# Patient Record
Sex: Male | Born: 1960 | Race: White | Hispanic: No | Smoking: Former smoker
Health system: Southern US, Community
[De-identification: ages and names within clinical notes are randomized; demographics above are authoritative.]

## PROBLEM LIST (undated history)

## (undated) DIAGNOSIS — I2510 Atherosclerotic heart disease of native coronary artery without angina pectoris: Secondary | ICD-10-CM

## (undated) DIAGNOSIS — I509 Heart failure, unspecified: Secondary | ICD-10-CM

## (undated) DIAGNOSIS — Z720 Tobacco use: Secondary | ICD-10-CM

## (undated) HISTORY — DX: Tobacco use: Z72.0

## (undated) HISTORY — DX: Heart failure, unspecified: I50.9

## (undated) HISTORY — DX: Atherosclerotic heart disease of native coronary artery without angina pectoris: I25.10

---

## 2006-02-15 ENCOUNTER — Ambulatory Visit: Payer: Self-pay | Admitting: Specialist

## 2006-02-17 ENCOUNTER — Emergency Department: Payer: Self-pay | Admitting: Internal Medicine

## 2006-10-06 ENCOUNTER — Emergency Department: Payer: Self-pay | Admitting: Emergency Medicine

## 2006-10-07 ENCOUNTER — Emergency Department: Payer: Self-pay | Admitting: Emergency Medicine

## 2007-11-16 IMAGING — CT CT ABD-PELV W/ CM
1 of 3 series · 14 of 32 positions shown, 19 images · non-contrast
Comparison: none

REASON FOR EXAM: Rm 7. IV only  2 day post
COMMENTS:

[Series 3: abd/pelvis · axial · 0.69mm/px · z∈[-464,+10]mm · 14 of 180 slices shown, 19 images]
[im 11/180  soft-tissue]
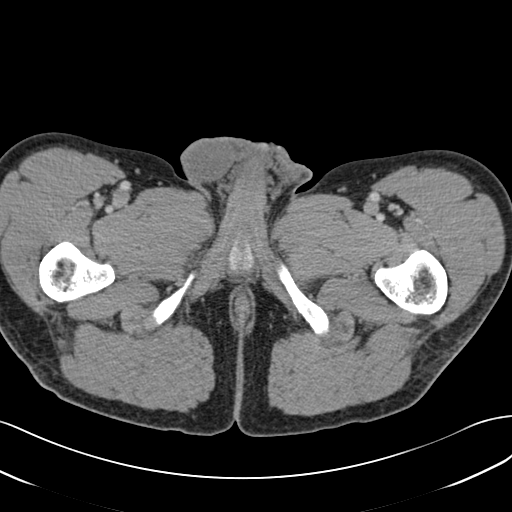
[im 11/180  bone]
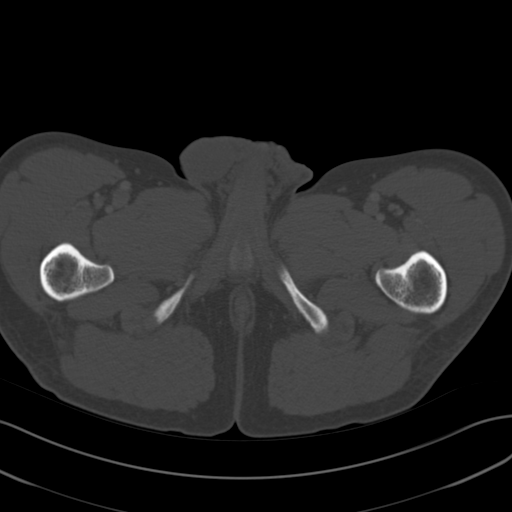
[im 22/180  soft-tissue]
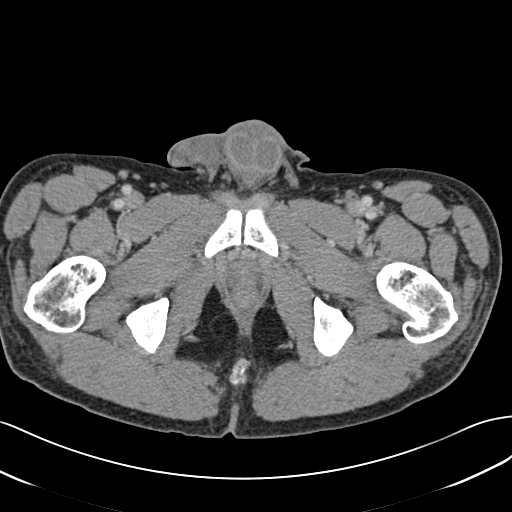
[im 43/180  soft-tissue]
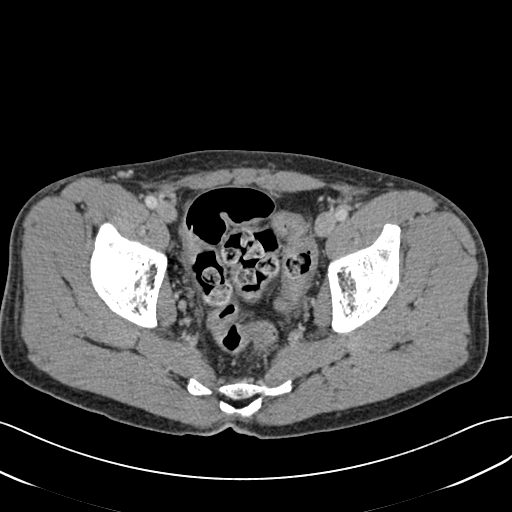
[im 53/180  soft-tissue]
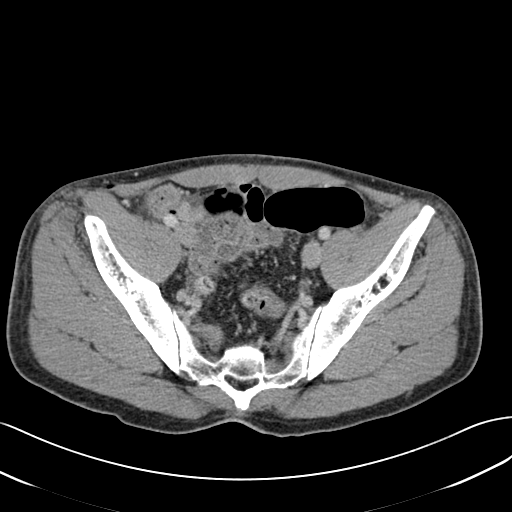
[im 64/180  soft-tissue]
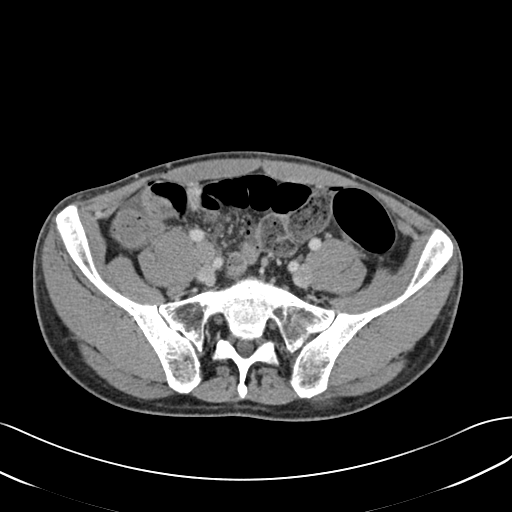
[im 74/180  soft-tissue]
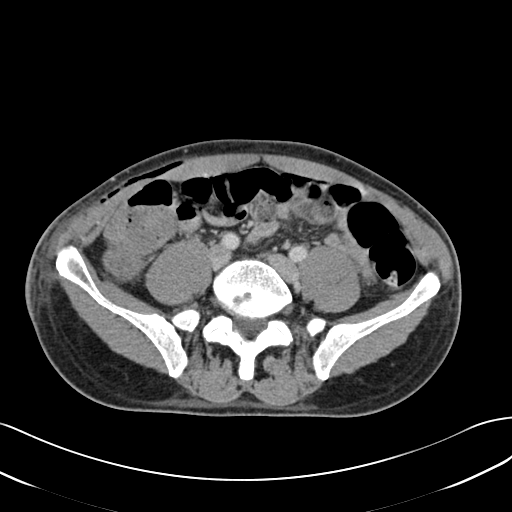
[im 95/180  soft-tissue]
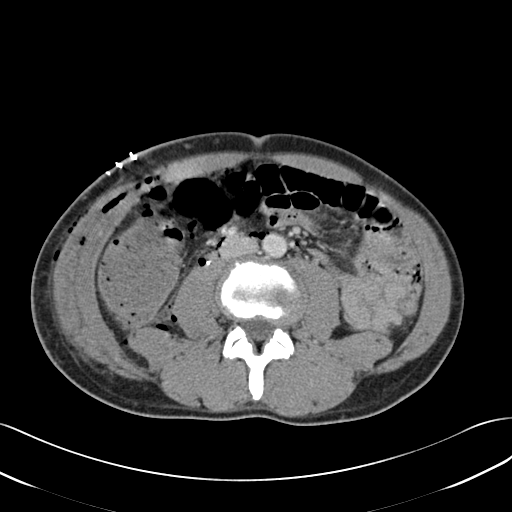
[im 106/180  soft-tissue]
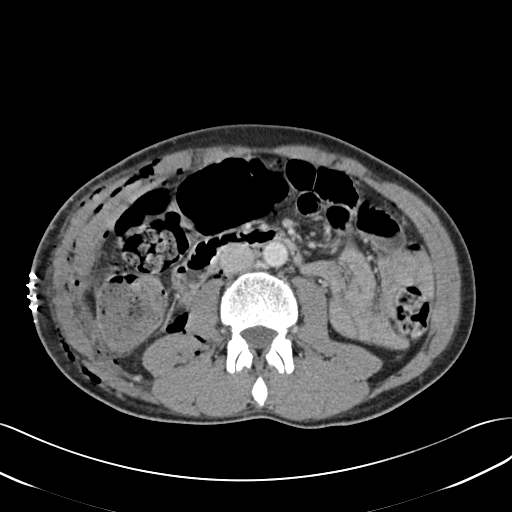
[im 116/180  soft-tissue]
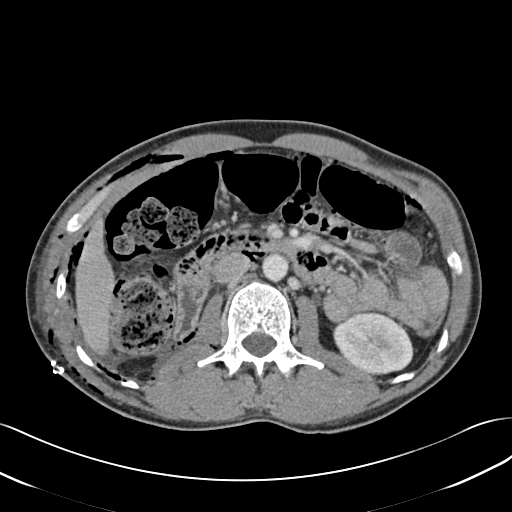
[im 116/180  bone]
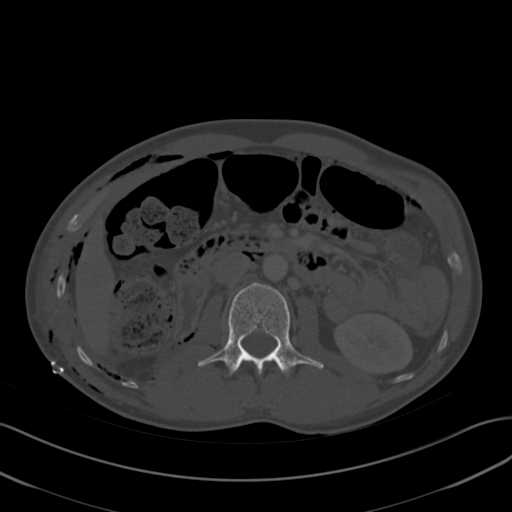
[im 127/180  soft-tissue]
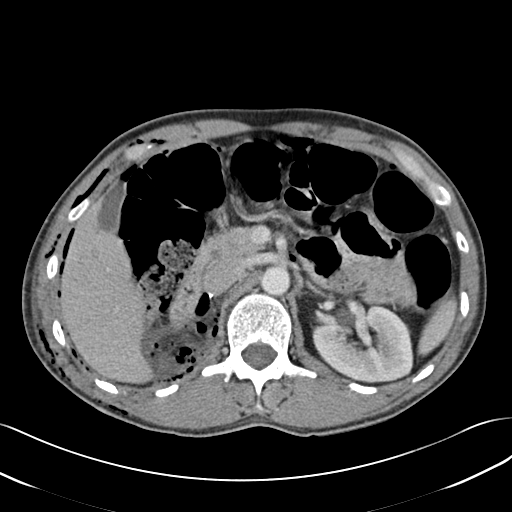
[im 137/180  soft-tissue]
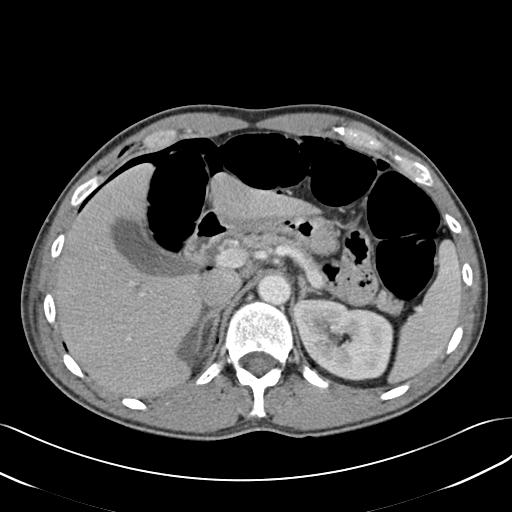
[im 137/180  lung]
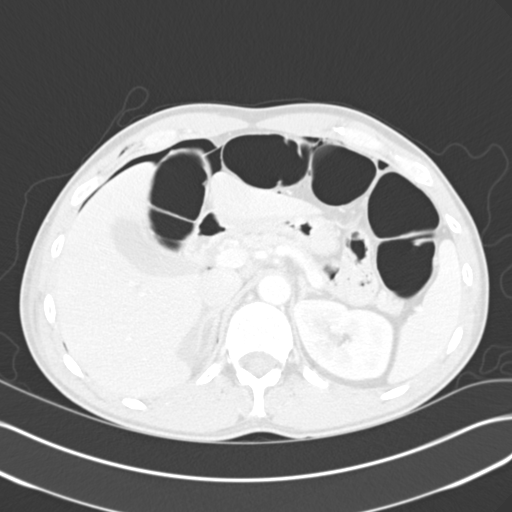
[im 148/180  lung]
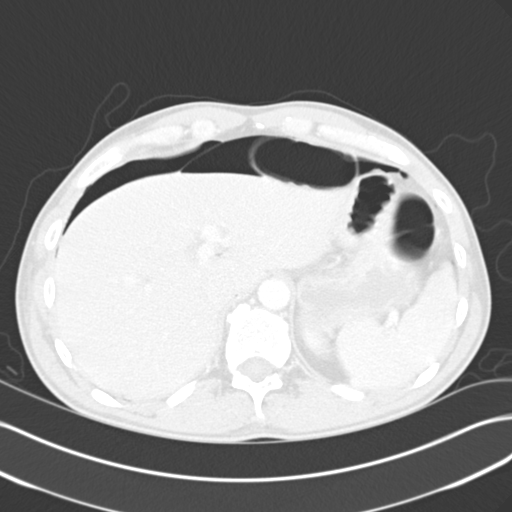
[im 158/180  soft-tissue]
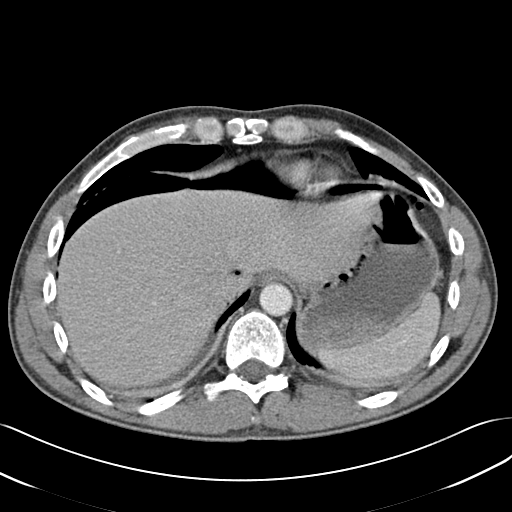
[im 158/180  lung]
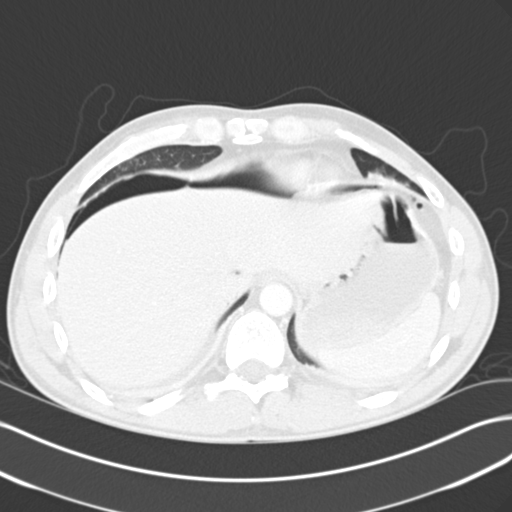
[im 169/180  soft-tissue]
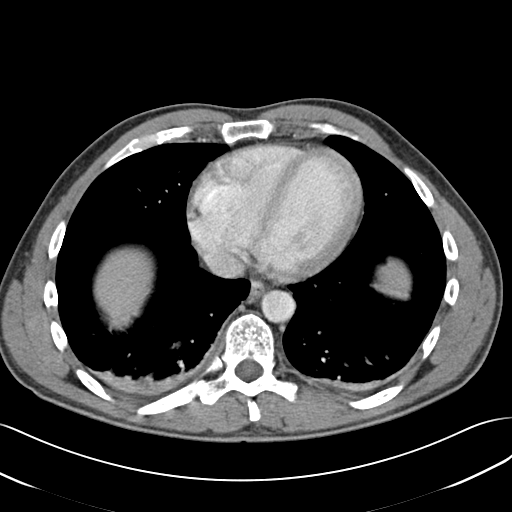
[im 169/180  lung]
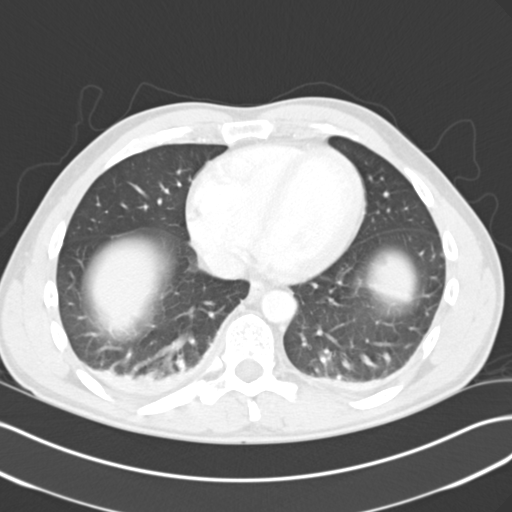

[14 of 32 positions shown; findings below may reference images not displayed]

PROCEDURE:     CT  - CT ABDOMEN / PELVIS  W  - February 17, 2006  [DATE]

RESULT:     An emergent abdominal/pelvic CT if obtained for pain at the
incision site from two days post removal of the RIGHT kidney.  In the RIGHT
renal bed there is noted fluid with multiple air bubbles.  This may all be
post-surgical in nature and no drainable fluid collection is seen.  There
are air bubbles about the iliopsoas.  There is noted free air in the abdomen
about the liver and as stated about the iliopsoas on the RIGHT and extends
anterior to the liver.  Subcutaneous air is noted in the RIGHT lateral
abdominal wall anteriorly extending down to the midpelvis.  There is noted
bilateral small pleural effusions and atelectasis in the lung bases.  The
LEFT kidney is functioning excreting the contrast with no masses, no
hydronephrosis.  The liver and spleen appear intact.  There is incidentally
noted some air droplets about the cava and aorta.  It is not in the pelvis.
IMPRESSION: Findings most likely postoperative in nature with some fluid in the
postoperative bed in the RIGHT renal fossa.  There are multiple air droplets
in the RIGHT renal fossa as well as droplets of air about the RIGHT
iliopsoas and extending anteriorly over the cava and aorta with free air
about the liver and anterior to the liver.

Bilateral small effusions and atelectasis are noted in the lung bases.

The report was called to the emergency room at the conclusion of dictation.

## 2011-10-08 ENCOUNTER — Ambulatory Visit: Payer: Self-pay | Admitting: Family Medicine

## 2020-11-07 ENCOUNTER — Other Ambulatory Visit: Payer: Self-pay

## 2020-11-07 ENCOUNTER — Inpatient Hospital Stay
Admission: EM | Admit: 2020-11-07 | Discharge: 2020-11-13 | DRG: 246 | Disposition: A | Discharge status: Home / Self Care | Payer: 59 | Attending: Internal Medicine | Admitting: Internal Medicine

## 2020-11-07 ENCOUNTER — Encounter
Admission: EM | Disposition: A | Discharge status: Home / Self Care | Payer: Self-pay | Source: Home / Self Care | Attending: Internal Medicine

## 2020-11-07 DIAGNOSIS — J180 Bronchopneumonia, unspecified organism: Secondary | ICD-10-CM | POA: Diagnosis not present

## 2020-11-07 DIAGNOSIS — R509 Fever, unspecified: Secondary | ICD-10-CM

## 2020-11-07 DIAGNOSIS — Z20822 Contact with and (suspected) exposure to covid-19: Secondary | ICD-10-CM | POA: Diagnosis present

## 2020-11-07 DIAGNOSIS — F10239 Alcohol dependence with withdrawal, unspecified: Secondary | ICD-10-CM | POA: Diagnosis present

## 2020-11-07 DIAGNOSIS — N179 Acute kidney failure, unspecified: Secondary | ICD-10-CM | POA: Diagnosis present

## 2020-11-07 DIAGNOSIS — D72829 Elevated white blood cell count, unspecified: Secondary | ICD-10-CM | POA: Diagnosis not present

## 2020-11-07 DIAGNOSIS — I213 ST elevation (STEMI) myocardial infarction of unspecified site: Secondary | ICD-10-CM | POA: Diagnosis present

## 2020-11-07 DIAGNOSIS — I255 Ischemic cardiomyopathy: Secondary | ICD-10-CM | POA: Diagnosis present

## 2020-11-07 DIAGNOSIS — I071 Rheumatic tricuspid insufficiency: Secondary | ICD-10-CM | POA: Diagnosis present

## 2020-11-07 DIAGNOSIS — Z716 Tobacco abuse counseling: Secondary | ICD-10-CM

## 2020-11-07 DIAGNOSIS — J690 Pneumonitis due to inhalation of food and vomit: Secondary | ICD-10-CM | POA: Diagnosis not present

## 2020-11-07 DIAGNOSIS — F17210 Nicotine dependence, cigarettes, uncomplicated: Secondary | ICD-10-CM | POA: Diagnosis not present

## 2020-11-07 DIAGNOSIS — I4901 Ventricular fibrillation: Secondary | ICD-10-CM | POA: Diagnosis present

## 2020-11-07 DIAGNOSIS — I472 Ventricular tachycardia: Secondary | ICD-10-CM | POA: Diagnosis present

## 2020-11-07 DIAGNOSIS — I2102 ST elevation (STEMI) myocardial infarction involving left anterior descending coronary artery: Secondary | ICD-10-CM | POA: Diagnosis present

## 2020-11-07 DIAGNOSIS — I959 Hypotension, unspecified: Secondary | ICD-10-CM | POA: Diagnosis not present

## 2020-11-07 DIAGNOSIS — F17200 Nicotine dependence, unspecified, uncomplicated: Secondary | ICD-10-CM | POA: Diagnosis present

## 2020-11-07 HISTORY — PX: LEFT HEART CATH AND CORONARY ANGIOGRAPHY: CATH118249

## 2020-11-07 HISTORY — PX: CORONARY/GRAFT ACUTE MI REVASCULARIZATION: CATH118305

## 2020-11-07 LAB — CBC WITH DIFFERENTIAL/PLATELET
Abs Immature Granulocytes: 0.06 10*3/uL (ref 0.00–0.07)
Basophils Absolute: 0.1 10*3/uL (ref 0.0–0.1)
Basophils Relative: 1 %
Eosinophils Absolute: 0.2 10*3/uL (ref 0.0–0.5)
Eosinophils Relative: 2 %
HCT: 40.1 % (ref 39.0–52.0)
Hemoglobin: 13.3 g/dL (ref 13.0–17.0)
Immature Granulocytes: 0 %
Lymphocytes Relative: 47 %
Lymphs Abs: 6.9 10*3/uL — ABNORMAL HIGH (ref 0.7–4.0)
MCH: 30.4 pg (ref 26.0–34.0)
MCHC: 33.2 g/dL (ref 30.0–36.0)
MCV: 91.6 fL (ref 80.0–100.0)
Monocytes Absolute: 1 10*3/uL (ref 0.1–1.0)
Monocytes Relative: 7 %
Neutro Abs: 6.2 10*3/uL (ref 1.7–7.7)
Neutrophils Relative %: 43 %
Platelets: 302 10*3/uL (ref 150–400)
RBC: 4.38 MIL/uL (ref 4.22–5.81)
RDW: 13.2 % (ref 11.5–15.5)
WBC: 14.5 10*3/uL — ABNORMAL HIGH (ref 4.0–10.5)
nRBC: 0 % (ref 0.0–0.2)

## 2020-11-07 LAB — BASIC METABOLIC PANEL
Anion gap: 16 — ABNORMAL HIGH (ref 5–15)
BUN: 15 mg/dL (ref 6–20)
CO2: 17 mmol/L — ABNORMAL LOW (ref 22–32)
Calcium: 8.4 mg/dL — ABNORMAL LOW (ref 8.9–10.3)
Chloride: 104 mmol/L (ref 98–111)
Creatinine, Ser: 1.52 mg/dL — ABNORMAL HIGH (ref 0.61–1.24)
GFR, Estimated: 53 mL/min — ABNORMAL LOW (ref >60)
Glucose, Bld: 189 mg/dL — ABNORMAL HIGH (ref 70–99)
Potassium: 3.3 mmol/L — ABNORMAL LOW (ref 3.5–5.1)
Sodium: 137 mmol/L (ref 135–145)

## 2020-11-07 LAB — RESP PANEL BY RT-PCR (FLU A&B, COVID) ARPGX2
Influenza A by PCR: NEGATIVE
Influenza B by PCR: NEGATIVE
SARS Coronavirus 2 by RT PCR: NEGATIVE

## 2020-11-07 LAB — POCT ACTIVATED CLOTTING TIME
Activated Clotting Time: 297 seconds
Activated Clotting Time: 321 seconds

## 2020-11-07 LAB — TROPONIN I (HIGH SENSITIVITY): Troponin I (High Sensitivity): 90 ng/L — ABNORMAL HIGH (ref <18)

## 2020-11-07 SURGERY — CORONARY/GRAFT ACUTE MI REVASCULARIZATION
Anesthesia: Moderate Sedation

## 2020-11-07 MED ORDER — AMIODARONE HCL IN DEXTROSE 360-4.14 MG/200ML-% IV SOLN
INTRAVENOUS | Status: AC
  Filled 2020-11-07: qty 200

## 2020-11-07 MED ORDER — HEPARIN SODIUM (PORCINE) 1000 UNIT/ML IJ SOLN
INTRAMUSCULAR | Status: DC | PRN
  Administered 2020-11-07: 2000 [IU] via INTRAVENOUS
  Administered 2020-11-07: 10000 [IU] via INTRAVENOUS

## 2020-11-07 MED ORDER — LIDOCAINE HCL (PF) 1 % IJ SOLN
INTRAMUSCULAR | Status: DC | PRN
  Administered 2020-11-07: 2 mL

## 2020-11-07 MED ORDER — FENTANYL CITRATE (PF) 100 MCG/2ML IJ SOLN
INTRAMUSCULAR | Status: AC
  Filled 2020-11-07: qty 2

## 2020-11-07 MED ORDER — BIVALIRUDIN TRIFLUOROACETATE 250 MG IV SOLR
INTRAVENOUS | Status: AC
  Filled 2020-11-07: qty 250

## 2020-11-07 MED ORDER — FENTANYL CITRATE (PF) 100 MCG/2ML IJ SOLN
INTRAMUSCULAR | Status: DC | PRN
  Administered 2020-11-07: 25 ug via INTRAVENOUS

## 2020-11-07 MED ORDER — TICAGRELOR 90 MG PO TABS
ORAL_TABLET | ORAL | Status: DC | PRN
  Administered 2020-11-07: 180 mg via ORAL

## 2020-11-07 MED ORDER — AMIODARONE HCL 150 MG/3ML IV SOLN
INTRAVENOUS | Status: AC | PRN
  Administered 2020-11-07: 349.496 ug/kg/min via INTRAVENOUS

## 2020-11-07 MED ORDER — IOHEXOL 300 MG/ML  SOLN
INTRAMUSCULAR | Status: DC | PRN
  Administered 2020-11-07: 260 mL

## 2020-11-07 MED ORDER — HEPARIN (PORCINE) IN NACL 1000-0.9 UT/500ML-% IV SOLN
INTRAVENOUS | Status: DC | PRN
  Administered 2020-11-07 (×2): 500 mL

## 2020-11-07 MED ORDER — MORPHINE SULFATE (PF) 2 MG/ML IV SOLN
INTRAVENOUS | Status: AC
  Administered 2020-11-07: 2 mg via INTRAVENOUS
  Filled 2020-11-07: qty 1

## 2020-11-07 MED ORDER — HEPARIN SODIUM (PORCINE) 1000 UNIT/ML IJ SOLN
INTRAMUSCULAR | Status: AC
  Filled 2020-11-07: qty 1

## 2020-11-07 MED ORDER — AMIODARONE HCL 150 MG/3ML IV SOLN
INTRAVENOUS | Status: AC
  Filled 2020-11-07: qty 3

## 2020-11-07 MED ORDER — MIDAZOLAM HCL 2 MG/2ML IJ SOLN
INTRAMUSCULAR | Status: AC
  Filled 2020-11-07: qty 2

## 2020-11-07 MED ORDER — LIDOCAINE HCL (PF) 1 % IJ SOLN
INTRAMUSCULAR | Status: AC
  Filled 2020-11-07: qty 30

## 2020-11-07 MED ORDER — MIDAZOLAM HCL 2 MG/2ML IJ SOLN
INTRAMUSCULAR | Status: DC | PRN
  Administered 2020-11-07: 1 mg via INTRAVENOUS

## 2020-11-07 MED ORDER — AMIODARONE HCL 150 MG/3ML IV SOLN
INTRAVENOUS | Status: DC | PRN
  Administered 2020-11-07: 150 mg via INTRAVENOUS

## 2020-11-07 MED ORDER — TICAGRELOR 90 MG PO TABS
ORAL_TABLET | ORAL | Status: AC
  Filled 2020-11-07: qty 2

## 2020-11-07 MED ORDER — MORPHINE SULFATE (PF) 2 MG/ML IV SOLN: 2.0000 mg | Freq: Once | INTRAVENOUS | Status: AC

## 2020-11-07 MED ORDER — VERAPAMIL HCL 2.5 MG/ML IV SOLN
INTRAVENOUS | Status: AC
  Filled 2020-11-07: qty 2

## 2020-11-07 MED ORDER — HEPARIN (PORCINE) IN NACL 1000-0.9 UT/500ML-% IV SOLN
INTRAVENOUS | Status: AC
  Filled 2020-11-07: qty 1000

## 2020-11-07 SURGICAL SUPPLY — 15 items
BALLN TREK RX 2.25X15 (BALLOONS) ×3
BALLOON TREK RX 2.25X15 (BALLOONS) ×1 IMPLANT
CATH INFINITI 5FR ANG PIGTAIL (CATHETERS) ×3 IMPLANT
CATH INFINITI JR4 5F (CATHETERS) ×3 IMPLANT
CATH VISTA GUIDE 6FR JL3.5 (CATHETERS) ×3 IMPLANT
DEVICE RAD TR BAND REGULAR (VASCULAR PRODUCTS) ×3 IMPLANT
GLIDESHEATH SLEND SS 6F .021 (SHEATH) ×3 IMPLANT
GUIDEWIRE INQWIRE 1.5J.035X260 (WIRE) ×1 IMPLANT
INQWIRE 1.5J .035X260CM (WIRE) ×3
KIT ENCORE 26 ADVANTAGE (KITS) ×3 IMPLANT
KIT MANI 3VAL PERCEP (MISCELLANEOUS) ×3 IMPLANT
PACK CARDIAC CATH (CUSTOM PROCEDURE TRAY) ×3 IMPLANT
STENT SYNERGY DES 2.50X8 (Permanent Stent) ×3 IMPLANT
STENT SYNERGY DES 3X28 (Permanent Stent) ×3 IMPLANT
WIRE ASAHI PROWATER 180CM (WIRE) ×6 IMPLANT

## 2020-11-07 NOTE — ED Provider Notes (Signed)
[facility] Emergency Department Provider Note ____________________________________________   Event Date/Time   First MD Initiated Contact with Patient 11/07/20 2223     (approximate)  I have reviewed the triage vital signs and the nursing notes.   HISTORY  Chief Complaint Code STEMI    HPI Timothy Schultz is a 59 y.o. male with no significant past medical history who presents with chest pain, acute onset about an hour prior to coming to the ED, substernal, constant, and not relieved by nitro paste and fentanyl given by EMS.  He denies any prior cardiac history.  Per EMS, the patient had an episode of seizure-like activity and was found to be in ventricular fibrillation.  He was defibrillated once and return to sinus rhythm.  EKG showed STEMI, and code STEMI was activated while en route.  History reviewed. No pertinent past medical history.  There are no problems to display for this patient.   History reviewed. No pertinent surgical history.  Prior to Admission medications   Not on File    Allergies Patient has no known allergies.  No family history on file.  Social History Social History   Tobacco Use  . Smoking status: Current Every Day Smoker  . Smokeless tobacco: Never Used  Substance Use Topics  . Alcohol use: Not Currently    Review of Systems  Constitutional: No fever. Eyes: No visual changes. ENT: No sore throat. Cardiovascular: Positive for chest pain. Respiratory: Denies shortness of breath. Gastrointestinal: No vomiting. Genitourinary: Negative for dysuria.  Musculoskeletal: Negative for back pain. Skin: Negative for rash. Neurological: Negative for headache.   ____________________________________________   PHYSICAL EXAM:  VITAL SIGNS: ED Triage Vitals  Enc Vitals Group     BP 11/07/20 2223 129/79     Pulse Rate 11/07/20 2217 (!) 110     Resp 11/07/20 2217 18     Temp 11/07/20 2217 97.7 F (36.5 C)      Temp Source 11/07/20 2217 Oral     SpO2 11/07/20 2223 98 %     Weight 11/07/20 2217 175 lb (79.4 kg)     Height 11/07/20 2217 5\' 11"  (1.803 m)     Head Circumference --      Peak Flow --      Pain Score 11/07/20 2217 8     Pain Loc --      Pain Edu? --      Excl. in GC? --     Constitutional: Alert and oriented.  Uncomfortable and weak appearing but in no acute distress. Eyes: Conjunctivae are normal.  Head: Atraumatic. Nose: No congestion/rhinnorhea. Mouth/Throat: Mucous membranes are moist.   Neck: Normal range of motion.  Cardiovascular: Normal rate, regular rhythm. Grossly normal heart sounds.  Good peripheral circulation. Respiratory: Increased respiratory effort.  No retractions. Lungs CTAB. Gastrointestinal: Soft and nontender. No distention.  Genitourinary: No flank tenderness. Musculoskeletal: No lower extremity edema.  Extremities warm and well perfused.  Neurologic:  Normal speech and language. No gross focal neurologic deficits are appreciated.  Skin:  Skin is warm and dry. No rash noted. Psychiatric: Mood and affect are normal. Speech and behavior are normal.  ____________________________________________   LABS (all labs ordered are listed, but only abnormal results are displayed)  Labs Reviewed  BASIC METABOLIC PANEL - Abnormal; Notable for the following components:      Result Value   Potassium 3.3 (*)    CO2 17 (*)    Glucose, Bld 189 (*)  Creatinine, Ser 1.52 (*)    Calcium 8.4 (*)    GFR, Estimated 53 (*)    Anion gap 16 (*)    All other components within normal limits  CBC WITH DIFFERENTIAL/PLATELET - Abnormal; Notable for the following components:   WBC 14.5 (*)    Lymphs Abs 6.9 (*)    All other components within normal limits  TROPONIN I (HIGH SENSITIVITY) - Abnormal; Notable for the following components:   Troponin I (High Sensitivity) 90 (*)    All other components within normal limits  RESP PANEL BY RT-PCR (FLU A&B, COVID) ARPGX2    ____________________________________________  EKG  ED ECG REPORT I, Dionne Bucy, the attending physician, personally viewed and interpreted this ECG.  Date: 11/07/2020 EKG Time: 2216 Rate: 111 Rhythm: Sinus tachycardia QRS Axis: normal Intervals: normal ST/T Wave abnormalities: Anterior ST elevation Narrative Interpretation: Anterior STEMI  ____________________________________________  RADIOLOGY    ____________________________________________   PROCEDURES  Procedure(s) performed: No  Procedures  Critical Care performed: Yes  CRITICAL CARE Performed by: Dionne Bucy   Total critical care time: 30 minutes  Critical care time was exclusive of separately billable procedures and treating other patients.  Critical care was necessary to treat or prevent imminent or life-threatening deterioration.  Critical care was time spent personally by me on the following activities: development of treatment plan with patient and/or surrogate as well as nursing, discussions with consultants, evaluation of patient's response to treatment, examination of patient, obtaining history from patient or surrogate, ordering and performing treatments and interventions, ordering and review of laboratory studies, ordering and review of radiographic studies, pulse oximetry and re-evaluation of patient's condition. ____________________________________________   INITIAL IMPRESSION / ASSESSMENT AND PLAN / ED COURSE  Pertinent labs & imaging results that were available during my care of the patient were reviewed by me and considered in my medical decision making (see chart for details).  59 year old male with no significant cardiac history or risk factors presents with chest pain for the last hour.  Per EMS he had an episode of ventricular fibrillation then returned to sinus rhythm after being defibrillated once.  EKG subsequently showed ST elevation MI.  Code STEMI was activated in  the field.  The patient received aspirin, Nitro paste, and fentanyl while en route.  On the patient's arrival to the ED, Dr. Darrold Junker from cardiology was present at the bedside.  The patient was placed on the monitor and EKG confirmed STEMI.  On exam, the vital signs were normal except for tachycardia.  The patient appeared uncomfortable with somewhat increased work of breathing but no acute distress.  Exam was otherwise unremarkable.    Dr. Darrold Junker recommended taking the patient to the Cath Lab.    ____________________________________________   FINAL CLINICAL IMPRESSION(S) / ED DIAGNOSES  Final diagnoses:  ST elevation myocardial infarction (STEMI), unspecified artery (HCC)      NEW MEDICATIONS STARTED DURING THIS VISIT:  There are no discharge medications for this patient.    Note:  This document was prepared using Dragon voice recognition software and may include unintentional dictation errors.    Dionne Bucy, MD 11/07/20 2306

## 2020-11-07 NOTE — ED Triage Notes (Addendum)
Pt here via [facility] from home with c/o chest pain starting at 2117 today.   Per EMS STEMI shown on ekg. Pt had seizure like event then in vfib with ems- shocked at 2137.  Pt describes pain as 8/10. Aspirin administered/ nitro paste and 18mg fentanyl by EMS.   No history of heart related issues. Mac RN at bedside.   22:10 cardiologist at bedside.

## 2020-11-08 ENCOUNTER — Inpatient Hospital Stay
Admit: 2020-11-08 | Discharge: 2020-11-08 | Disposition: A | Discharge status: Home / Self Care | Payer: 59 | Attending: Internal Medicine | Admitting: Internal Medicine

## 2020-11-08 ENCOUNTER — Encounter: Payer: Self-pay | Admitting: Internal Medicine

## 2020-11-08 DIAGNOSIS — I2102 ST elevation (STEMI) myocardial infarction involving left anterior descending coronary artery: Principal | ICD-10-CM

## 2020-11-08 DIAGNOSIS — I213 ST elevation (STEMI) myocardial infarction of unspecified site: Secondary | ICD-10-CM | POA: Diagnosis present

## 2020-11-08 LAB — CBC
HCT: 40.1 % (ref 39.0–52.0)
Hemoglobin: 14 g/dL (ref 13.0–17.0)
MCH: 31.3 pg (ref 26.0–34.0)
MCHC: 34.9 g/dL (ref 30.0–36.0)
MCV: 89.5 fL (ref 80.0–100.0)
Platelets: 264 10*3/uL (ref 150–400)
RBC: 4.48 MIL/uL (ref 4.22–5.81)
RDW: 13.3 % (ref 11.5–15.5)
WBC: 13.7 10*3/uL — ABNORMAL HIGH (ref 4.0–10.5)
nRBC: 0 % (ref 0.0–0.2)

## 2020-11-08 LAB — URINE DRUG SCREEN, QUALITATIVE (ARMC ONLY)
Amphetamines, Ur Screen: NOT DETECTED
Barbiturates, Ur Screen: NOT DETECTED
Benzodiazepine, Ur Scrn: POSITIVE — AB
Cannabinoid 50 Ng, Ur ~~LOC~~: NOT DETECTED
Cocaine Metabolite,Ur ~~LOC~~: NOT DETECTED
MDMA (Ecstasy)Ur Screen: NOT DETECTED
Methadone Scn, Ur: NOT DETECTED
Opiate, Ur Screen: POSITIVE — AB
Phencyclidine (PCP) Ur S: NOT DETECTED
Tricyclic, Ur Screen: NOT DETECTED

## 2020-11-08 LAB — LIPID PANEL
Cholesterol: 192 mg/dL (ref 0–200)
HDL: 42 mg/dL (ref >40)
LDL Cholesterol: 130 mg/dL — ABNORMAL HIGH (ref 0–99)
Total CHOL/HDL Ratio: 4.6 RATIO
Triglycerides: 99 mg/dL (ref <150)
VLDL: 20 mg/dL (ref 0–40)

## 2020-11-08 LAB — BASIC METABOLIC PANEL
Anion gap: 11 (ref 5–15)
BUN: 16 mg/dL (ref 6–20)
CO2: 18 mmol/L — ABNORMAL LOW (ref 22–32)
Calcium: 8.9 mg/dL (ref 8.9–10.3)
Chloride: 106 mmol/L (ref 98–111)
Creatinine, Ser: 1.22 mg/dL (ref 0.61–1.24)
GFR, Estimated: >60 mL/min (ref >60)
Glucose, Bld: 130 mg/dL — ABNORMAL HIGH (ref 70–99)
Potassium: 3.9 mmol/L (ref 3.5–5.1)
Sodium: 135 mmol/L (ref 135–145)

## 2020-11-08 LAB — TROPONIN I (HIGH SENSITIVITY): Troponin I (High Sensitivity): >27000 ng/L (ref <18)

## 2020-11-08 LAB — ECHOCARDIOGRAM COMPLETE
Area-P 1/2: 7.09 cm2
Height: 71 in
S' Lateral: 4.47 cm
Single Plane A4C EF: 35.6 %
Weight: 2754.87 oz

## 2020-11-08 LAB — HIV ANTIBODY (ROUTINE TESTING W REFLEX): HIV Screen 4th Generation wRfx: NONREACTIVE

## 2020-11-08 LAB — GLUCOSE, CAPILLARY: Glucose-Capillary: 205 mg/dL — ABNORMAL HIGH (ref 70–99)

## 2020-11-08 LAB — MRSA PCR SCREENING: MRSA by PCR: POSITIVE — AB

## 2020-11-08 MED ORDER — TICAGRELOR 90 MG PO TABS
90.0000 mg | ORAL_TABLET | Freq: Two times a day (BID) | ORAL | Status: DC
  Administered 2020-11-08 – 2020-11-12 (×10): 90 mg via ORAL
  Filled 2020-11-08 (×10): qty 1

## 2020-11-08 MED ORDER — METOPROLOL TARTRATE 25 MG PO TABS
25.0000 mg | ORAL_TABLET | Freq: Two times a day (BID) | ORAL | Status: DC
  Administered 2020-11-08 (×3): 25 mg via ORAL
  Filled 2020-11-08 (×4): qty 1

## 2020-11-08 MED ORDER — LABETALOL HCL 5 MG/ML IV SOLN
10.0000 mg | INTRAVENOUS | Status: AC | PRN
Start: 2020-11-08 — End: 2020-11-08

## 2020-11-08 MED ORDER — CHLORHEXIDINE GLUCONATE CLOTH 2 % EX PADS: 6.0000 | MEDICATED_PAD | Freq: Every day | CUTANEOUS | Status: DC

## 2020-11-08 MED ORDER — ONDANSETRON HCL 4 MG/2ML IJ SOLN: 4.0000 mg | Freq: Four times a day (QID) | INTRAMUSCULAR | Status: DC | PRN

## 2020-11-08 MED ORDER — SODIUM CHLORIDE 0.9% FLUSH
3.0000 mL | Freq: Two times a day (BID) | INTRAVENOUS | Status: DC
  Administered 2020-11-08 – 2020-11-12 (×10): 3 mL via INTRAVENOUS

## 2020-11-08 MED ORDER — METOPROLOL TARTRATE 25 MG PO TABS: 25.0000 mg | ORAL_TABLET | Freq: Two times a day (BID) | ORAL | Status: DC

## 2020-11-08 MED ORDER — PRASUGREL HCL 10 MG PO TABS
10.0000 mg | ORAL_TABLET | Freq: Every day | ORAL | Status: DC
  Administered 2020-11-08: 09:00:00 10 mg via ORAL
  Filled 2020-11-08: qty 1

## 2020-11-08 MED ORDER — BUPRENORPHINE HCL-NALOXONE HCL 8-2 MG SL SUBL
1.0000 | SUBLINGUAL_TABLET | Freq: Every day | SUBLINGUAL | Status: DC
  Administered 2020-11-08 – 2020-11-12 (×5): 1 via SUBLINGUAL
  Filled 2020-11-08 (×5): qty 1

## 2020-11-08 MED ORDER — ALPRAZOLAM 0.25 MG PO TABS
0.2500 mg | ORAL_TABLET | Freq: Once | ORAL | Status: AC
  Administered 2020-11-08: 08:00:00 0.25 mg via ORAL
  Filled 2020-11-08: qty 1

## 2020-11-08 MED ORDER — HYDRALAZINE HCL 20 MG/ML IJ SOLN: 10.0000 mg | INTRAMUSCULAR | Status: AC | PRN

## 2020-11-08 MED ORDER — ACETAMINOPHEN 325 MG PO TABS
650.0000 mg | ORAL_TABLET | ORAL | Status: DC | PRN
  Filled 2020-11-08: qty 2

## 2020-11-08 MED ORDER — NITROGLYCERIN 0.4 MG SL SUBL: 0.4000 mg | SUBLINGUAL_TABLET | SUBLINGUAL | Status: DC | PRN

## 2020-11-08 MED ORDER — SODIUM CHLORIDE 0.9 % WEIGHT BASED INFUSION
1.0000 mL/kg/h | INTRAVENOUS | Status: DC
  Administered 2020-11-08: 01:00:00 1 mL/kg/h via INTRAVENOUS

## 2020-11-08 MED ORDER — CHLORHEXIDINE GLUCONATE CLOTH 2 % EX PADS
6.0000 | MEDICATED_PAD | Freq: Every day | CUTANEOUS | Status: AC
  Administered 2020-11-08 – 2020-11-12 (×2): 6 via TOPICAL

## 2020-11-08 MED ORDER — SODIUM CHLORIDE 0.9 % IV SOLN
250.0000 mL | INTRAVENOUS | Status: DC | PRN
  Administered 2020-11-11 – 2020-11-13 (×4): 250 mL via INTRAVENOUS

## 2020-11-08 MED ORDER — ACETAMINOPHEN 325 MG PO TABS
650.0000 mg | ORAL_TABLET | ORAL | Status: DC | PRN
  Administered 2020-11-09: 650 mg via ORAL

## 2020-11-08 MED ORDER — ATORVASTATIN CALCIUM 20 MG PO TABS: 80.0000 mg | ORAL_TABLET | Freq: Every day | ORAL | Status: DC

## 2020-11-08 MED ORDER — ASPIRIN 81 MG PO CHEW: 81.0000 mg | CHEWABLE_TABLET | Freq: Every day | ORAL | Status: DC

## 2020-11-08 MED ORDER — TRAZODONE HCL 50 MG PO TABS
50.0000 mg | ORAL_TABLET | Freq: Every evening | ORAL | Status: DC | PRN
  Administered 2020-11-08 – 2020-11-12 (×4): 50 mg via ORAL
  Filled 2020-11-08 (×4): qty 1

## 2020-11-08 MED ORDER — VERAPAMIL HCL 2.5 MG/ML IV SOLN
INTRAVENOUS | Status: DC | PRN
  Administered 2020-11-07: 2.5 mg via INTRACORONARY

## 2020-11-08 MED ORDER — MUPIROCIN 2 % EX OINT
1.0000 "application " | TOPICAL_OINTMENT | Freq: Two times a day (BID) | CUTANEOUS | Status: AC
  Administered 2020-11-08 – 2020-11-12 (×10): 1 via NASAL
  Filled 2020-11-08: qty 22

## 2020-11-08 MED ORDER — ATORVASTATIN CALCIUM 80 MG PO TABS
80.0000 mg | ORAL_TABLET | Freq: Every day | ORAL | Status: DC
Start: 2020-11-08 — End: 2020-11-13
  Administered 2020-11-08 – 2020-11-12 (×5): 80 mg via ORAL
  Filled 2020-11-08 (×2): qty 1
  Filled 2020-11-08: qty 4
  Filled 2020-11-08 (×2): qty 1

## 2020-11-08 MED ORDER — ENOXAPARIN SODIUM 40 MG/0.4ML ~~LOC~~ SOLN
40.0000 mg | SUBCUTANEOUS | Status: DC
  Administered 2020-11-08 – 2020-11-12 (×5): 40 mg via SUBCUTANEOUS
  Filled 2020-11-08 (×5): qty 0.4

## 2020-11-08 MED ORDER — ASPIRIN EC 81 MG PO TBEC
81.0000 mg | DELAYED_RELEASE_TABLET | Freq: Every day | ORAL | Status: DC
  Administered 2020-11-09 – 2020-11-12 (×4): 81 mg via ORAL
  Filled 2020-11-08 (×4): qty 1

## 2020-11-08 MED ORDER — SODIUM CHLORIDE 0.9% FLUSH: 3.0000 mL | INTRAVENOUS | Status: DC | PRN

## 2020-11-08 NOTE — Progress Notes (Signed)
Verified with DR paraschos to stop Amiodarone GTTs after current bag is finished.

## 2020-11-08 NOTE — Progress Notes (Signed)
*  PRELIMINARY RESULTS* Echocardiogram 2D Echocardiogram has been performed.  Timothy Schultz 11/08/2020, 2:11 PM

## 2020-11-08 NOTE — Consult Note (Signed)
[facility] Cardiology  CARDIOLOGY CONSULT NOTE  Patient ID: Timothy Schultz MRN: [id number] DOB/AGE: 03-19-1961 59 y.o.  Admit date: 11/07/2020 Referring Physician [facility] Primary Physician  Primary Cardiologist  Reason for Consultation anterior STEMI  HPI: 59 year old gentleman furred for evaluation of anterior STEMI.  Patient was you state health until earlier this evening when he developed 9 out of 10 substernal chest pain.  EMS was called and upon arrival of the patient appears to have ventricular fibrillation arrest requiring defibrillation.  EKG revealed ST elevations in the anterior leads consistent with anterior STEMI.  Patient was brought to [facility] ED where he continued to experience 9 out of 10 chest pain with persistent ST elevation in the anterior leads.  Review of systems complete and found to be negative unless listed above     History reviewed. No pertinent past medical history.  History reviewed. No pertinent surgical history.  No medications prior to admission.   Social History   Socioeconomic History  . Marital status: Divorced    Spouse name: Not on file  . Number of children: Not on file  . Years of education: Not on file  . Highest education level: Not on file  Occupational History  . Not on file  Tobacco Use  . Smoking status: Current Every Day Smoker  . Smokeless tobacco: Never Used  Substance and Sexual Activity  . Alcohol use: Not Currently  . Drug use: Not on file  . Sexual activity: Not on file  Other Topics Concern  . Not on file  Social History Narrative  . Not on file   Social Determinants of Health   Financial Resource Strain: Not on file  Food Insecurity: Not on file  Transportation Needs: Not on file  Physical Activity: Not on file  Stress: Not on file  Social Connections: Not on file  Intimate Partner Violence: Not on file    No family history on file.    Review of systems complete and found to be negative unless listed above       PHYSICAL EXAM  General: Well developed, well nourished, in no acute distress HEENT:  Normocephalic and atramatic Neck:  No JVD.  Lungs: Clear bilaterally to auscultation and percussion. Heart: HRRR . Normal S1 and S2 without gallops or murmurs.  Abdomen: Bowel sounds are positive, abdomen soft and non-tender  Msk:  Back normal, normal gait. Normal strength and tone for age. Extremities: No clubbing, cyanosis or edema.   Neuro: Alert and oriented X 3. Psych:  Good affect, responds appropriately  Labs:   Lab Results  Component Value Date   WBC 14.5 (H) 11/07/2020   HGB 13.3 11/07/2020   HCT 40.1 11/07/2020   MCV 91.6 11/07/2020   PLT 302 11/07/2020    Recent Labs  Lab 11/07/20 2220  NA 137  K 3.3*  CL 104  CO2 17*  BUN 15  CREATININE 1.52*  CALCIUM 8.4*  GLUCOSE 189*   No results found for: CKTOTAL, CKMB, CKMBINDEX, TROPONINI No results found for: CHOL No results found for: HDL No results found for: LDLCALC No results found for: TRIG No results found for: CHOLHDL No results found for: LDLDIRECT    Radiology: CARDIAC CATHETERIZATION  Result Date: 11/07/2020  Prox LAD to Mid LAD lesion is 100% stenosed.  2nd Diag lesion is 70% stenosed.  1st Mrg lesion is 75% stenosed.  Prox Cx to Mid Cx lesion is 60% stenosed.  A drug-eluting stent was successfully placed using a STENT SYNERGY DES  23X28.  Post intervention, there is a 0% residual stenosis.  Mid LAD-1 lesion is 70% stenosed.  Mid LAD-2 lesion is 40% stenosed.  A drug-eluting stent was successfully placed using a STENT SYNERGY DES 2.50X8.  Post intervention, there is a 0% residual stenosis.  Mid LM lesion is 30% stenosed.  Prox RCA lesion is 70% stenosed.  1.  Anterior ST elevation myocardial infarction 2.  Occluded proximal LAD 3.  Preserved left ventricular function 4.  Successful primary PCI with overlapping 3,0 x 28 mm and 2.5 x 8 mm Synergy stents proximal/mid LAD Recommendations 1.  Dual antiplatelet  therapy uninterrupted for 1 year 2.  High intensity atorvastatin 3.  Start metoprolol tartrate 25 mg twice daily 4.  2D echocardiogram 5.  Strongly advised patient to stop smoking    EKG: Sinus rhythm with ST elevation anteriorly  ASSESSMENT AND PLAN:   1.  Anterior ST elevation myocardial infarction, located by ventricular fibrillation arrest with ongoing chest pain  Recommendations  Emergent cardiac catheterization and probable primary PCI  Signed: Marcina Millard MD,PhD, [facility] 11/08/2020, 12:16 AM

## 2020-11-08 NOTE — Progress Notes (Signed)
[facility] Cardiology  SUBJECTIVE: Patient laying in bed, reports mild residual chest discomfort, much improved compared to last night   Vitals:   11/08/20 0700 11/08/20 0753 11/08/20 0800 11/08/20 0900  BP: (!) 129/100 (!) 146/103 (!) 140/94 (!) 128/103  Pulse: 99 95 76 92  Resp:    20  Temp:  97.7 F (36.5 C)    TempSrc:  Oral    SpO2: 96% 97% 95% 97%  Weight:      Height:         Intake/Output Summary (Last 24 hours) at 11/08/2020 0630 Last data filed at 11/08/2020 0800 Gross per 24 hour  Intake 240 ml  Output 750 ml  Net -510 ml      PHYSICAL EXAM  General: Well developed, well nourished, in no acute distress HEENT:  Normocephalic and atramatic Neck:  No JVD.  Lungs: Clear bilaterally to auscultation and percussion. Heart: HRRR . Normal S1 and S2 without gallops or murmurs.  Abdomen: Bowel sounds are positive, abdomen soft and non-tender  Msk:  Back normal, normal gait. Normal strength and tone for age. Extremities: No clubbing, cyanosis or edema.   Neuro: Alert and oriented X 3. Psych:  Good affect, responds appropriately   LABS: Basic Metabolic Panel: Recent Labs    11/07/20 2220 11/08/20 0542  NA 137 135  K 3.3* 3.9  CL 104 106  CO2 17* 18*  GLUCOSE 189* 130*  BUN 15 16  CREATININE 1.52* 1.22  CALCIUM 8.4* 8.9   Liver Function Tests: No results for input(s): AST, ALT, ALKPHOS, BILITOT, PROT, ALBUMIN in the last 72 hours. No results for input(s): LIPASE, AMYLASE in the last 72 hours. CBC: Recent Labs    11/07/20 2220 11/08/20 0542  WBC 14.5* 13.7*  NEUTROABS 6.2  --   HGB 13.3 14.0  HCT 40.1 40.1  MCV 91.6 89.5  PLT 302 264   Cardiac Enzymes: No results for input(s): CKTOTAL, CKMB, CKMBINDEX, TROPONINI in the last 72 hours. BNP: Invalid input(s): POCBNP D-Dimer: No results for input(s): DDIMER in the last 72 hours. Hemoglobin A1C: No results for input(s): HGBA1C in the last 72 hours. Fasting Lipid Panel: Recent Labs    11/08/20 0542   CHOL 192  HDL 42  LDLCALC 130*  TRIG 99  CHOLHDL 4.6   Thyroid Function Tests: No results for input(s): TSH, T4TOTAL, T3FREE, THYROIDAB in the last 72 hours.  Invalid input(s): FREET3 Anemia Panel: No results for input(s): VITAMINB12, FOLATE, FERRITIN, TIBC, IRON, RETICCTPCT in the last 72 hours.  CARDIAC CATHETERIZATION  Result Date: 11/08/2020  Prox LAD to Mid LAD lesion is 100% stenosed.  2nd Diag lesion is 70% stenosed.  1st Mrg lesion is 75% stenosed.  Prox Cx to Mid Cx lesion is 60% stenosed.  A drug-eluting stent was successfully placed using a STENT SYNERGY DES 3X28.  Post intervention, there is a 0% residual stenosis.  Mid LAD-1 lesion is 70% stenosed.  Mid LAD-2 lesion is 40% stenosed.  A drug-eluting stent was successfully placed using a STENT SYNERGY DES 2.50X8.  Post intervention, there is a 0% residual stenosis.  Mid LM lesion is 30% stenosed.  Prox RCA lesion is 70% stenosed.  1.  Anterior ST elevation myocardial infarction 2.  Occluded proximal LAD 3.  Preserved left ventricular function 4.  Successful primary PCI with overlapping 3,0 x 28 mm and 2.5 x 8 mm Synergy stents proximal/mid LAD Recommendations 1.  Dual antiplatelet therapy uninterrupted for 1 year 2.  High intensity atorvastatin 3.  Start metoprolol tartrate 25 mg twice daily 4.  2D echocardiogram 5.  Strongly advised patient to stop smoking     Echo ending  TELEMETRY: Sinus rhythm:  ASSESSMENT AND PLAN:  Active Problems:   Acute ST elevation myocardial infarction (STEMI) involving left anterior descending (LAD) coronary artery (HCC)   STEMI (ST elevation myocardial infarction) (HCC)    1.  Anterior STEMI, status post primary PCI with overlapping DES proximal LAD, with mild residual  chest discomfort, much improved 2.  EtOH withdrawal 3.  Tobacco abuse  Recommendations  1.  Agree with current therapy 2.  Dual antiplatelet therapy uninterrupted for 1 year 2.  Continue metoprolol tartrate  25 mg twice daily 3.  Continue atorvastatin 80 mg daily 4.  Review 2D echocardiogram 5.  Further recommendations pending 2D echocardiogram results   Marcina Millard, MD, PhD, [facility] 11/08/2020 9:38 AM

## 2020-11-08 NOTE — Progress Notes (Signed)
eLink Physician-Brief Progress Note Patient Name: DERRIS MILLAN DOB: 1961/03/24 MRN: [id number]   Date of Service  11/08/2020  HPI/Events of Note  58M who was BIBA with anterior STEMI. He had an episode of VFib en route for which he received 1x defibrillation with return to sinus rhythm. Cath lab activated while en route. Findings most notable for 100% stenosed pLAD. A DES was placed to the pLAD with 0% residual stenosis.  Initial troponin I was 90. Cr 1.52. Serum bicarbonate level was 17. WBC 14.5.  Post-cath, he returns to ICU for post-STEMI monitoring. His BP is 121/96 (105), HR 86 bpm (SR). He is saturating 93% on 2L [place]. He is in NAD.   eICU Interventions  - DAPT with ASA/Prasugrel daily. - Atorvastatin 80mg  PO QHS - Metoprolol tartrate 25mg  PO BID - Echo in AM - Smoking cessation counseling - Gentle post-cath hydration overnight. Once Cr improves (assuming it is elevated in setting of AKI rather than CKD), start ACEi.     Intervention Category Evaluation Type: New Patient Evaluation  Avik Leoni 11/08/2020, 12:16 AM

## 2020-11-08 NOTE — Progress Notes (Signed)
Patient ID: Timothy Schultz, male   DOB: 07/28/1961, 59 y.o.   MRN: [id number] This is a no charge note.  Patient seen and examined H&P reviewed Timothy Schultz is a 59 y.o. male with no significant past medical history who presented earlier by EMS as code STEMI.  In route he had a seizure-like activity and was found to be in V. fib and was shocked.  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  He received 3 shocks during cath for V. tach/V. fib and was given an amiodarone bolus and placed on an amiodarone drip.  Postprocedure his chest pain was down to 1 out of 10 from 9 out of 10 preprocedure.  Cardiologist, Dr. Darrold Junker requested hospitalist admit post cath.  Currently he is anxious.  Would like something for sleep.  Has chest soreness.  vss cta  Regular s1/s2 Soft bening, +bs No edema  A/P: Stemi- continue cardiac meds F/u on echo DAPT uninterrupted for one year

## 2020-11-08 NOTE — H&P (Signed)
History and Physical    Timothy Schultz [id number]:[id number] DOB: Aug 24, 1961 DOA: 11/07/2020  PCP: No primary care provider on file.   Patient coming from: Home  I have personally briefly reviewed patient's old medical records in [facility]  Chief Complaint: Chest pain, status post cath  HPI: Timothy Schultz is a 59 y.o. male with no significant past medical history who presented earlier by EMS as code STEMI.  In route he had a seizure-like activity and was found to be in V. fib and was shocked.  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  He received 3 shocks during cath for V. tach/V. fib and was given an amiodarone bolus and placed on an amiodarone drip.  Postprocedure his chest pain was down to 1 out of 10 from 9 out of 10 preprocedure.  Cardiologist, Dr. Darrold Junker requested hospitalist admit post cath Review of Systems: As per HPI otherwise all other systems on review of systems negative.    History reviewed. No pertinent past medical history.  History reviewed. No pertinent surgical history.   reports that he has been smoking. He has never used smokeless tobacco. He reports previous alcohol use. No history on file for drug use.  No Known Allergies  History reviewed. No pertinent family history.    Prior to Admission medications   Not on File    Physical Exam: Vitals:   11/07/20 2226 11/07/20 2230 11/07/20 2242 11/07/20 2326  BP: (!) 132/95 (!) 129/93    Pulse: (!) 105 (!) 108    Resp: (!) 26 (!) 24    Temp:      TempSrc:      SpO2: 98% 98% 91% 94%  Weight:      Height:         Vitals:   11/07/20 2226 11/07/20 2230 11/07/20 2242 11/07/20 2326  BP: (!) 132/95 (!) 129/93    Pulse: (!) 105 (!) 108    Resp: (!) 26 (!) 24    Temp:      TempSrc:      SpO2: 98% 98% 91% 94%  Weight:      Height:          Constitutional:  Drowsy, ill-appearing, oriented x3  HEENT:       Head: Normocephalic and atraumatic.         Eyes: PERLA, EOMI, Conjunctivae are normal. Sclera is non-icteric.       Mouth/Throat: Mucous membranes are moist.       Neck: Supple with no signs of meningismus. Cardiovascular: Regular rate and rhythm. No murmurs, gallops, or rubs. 2+ symmetrical distal pulses are present . No JVD. No LE edema Respiratory: Respiratory effort normal .Lungs sounds clear bilaterally. No wheezes, crackles, or rhonchi.  Gastrointestinal: Soft, non tender, and non distended with positive bowel sounds.  Genitourinary: No CVA tenderness. Musculoskeletal: Nontender with normal range of motion in all extremities. No cyanosis, or erythema of extremities. Neurologic:  Face is symmetric. Moving all extremities. No gross focal neurologic deficits . Skin: Skin is warm, dry.  No rash or ulcers Psychiatric: Mood and affect are normal    Labs on Admission: I have personally reviewed following labs and imaging studies  CBC: Recent Labs  Lab 11/07/20 2220  WBC 14.5*  NEUTROABS 6.2  HGB 13.3  HCT 40.1  MCV 91.6  PLT 302   Basic Metabolic Panel: Recent Labs  Lab 11/07/20 2220  NA 137  K 3.3*  CL 104  CO2 17*  GLUCOSE 189*  BUN 15  CREATININE 1.52*  CALCIUM 8.4*   GFR: Estimated Creatinine Clearance: 56.4 mL/min (A) (by C-G formula based on SCr of 1.52 mg/dL (H)). Liver Function Tests: No results for input(s): AST, ALT, ALKPHOS, BILITOT, PROT, ALBUMIN in the last 168 hours. No results for input(s): LIPASE, AMYLASE in the last 168 hours. No results for input(s): AMMONIA in the last 168 hours. Coagulation Profile: No results for input(s): INR, PROTIME in the last 168 hours. Cardiac Enzymes: No results for input(s): CKTOTAL, CKMB, CKMBINDEX, TROPONINI in the last 168 hours. BNP (last 3 results) No results for input(s): PROBNP in the last 8760 hours. HbA1C: No results for input(s): HGBA1C in the last 72 hours. CBG: No results for input(s): GLUCAP in the  last 168 hours. Lipid Profile: No results for input(s): CHOL, HDL, LDLCALC, TRIG, CHOLHDL, LDLDIRECT in the last 72 hours. Thyroid Function Tests: No results for input(s): TSH, T4TOTAL, FREET4, T3FREE, THYROIDAB in the last 72 hours. Anemia Panel: No results for input(s): VITAMINB12, FOLATE, FERRITIN, TIBC, IRON, RETICCTPCT in the last 72 hours. Urine analysis: No results found for: COLORURINE, APPEARANCEUR, LABSPEC, PHURINE, GLUCOSEU, HGBUR, BILIRUBINUR, KETONESUR, PROTEINUR, UROBILINOGEN, NITRITE, LEUKOCYTESUR  Radiological Exams on Admission: CARDIAC CATHETERIZATION  Result Date: 11/07/2020  Prox LAD to Mid LAD lesion is 100% stenosed.  2nd Diag lesion is 70% stenosed.  1st Mrg lesion is 75% stenosed.  Prox Cx to Mid Cx lesion is 60% stenosed.  A drug-eluting stent was successfully placed using a STENT SYNERGY DES 3X28.  Post intervention, there is a 0% residual stenosis.  Mid LAD-1 lesion is 70% stenosed.  Mid LAD-2 lesion is 40% stenosed.  A drug-eluting stent was successfully placed using a STENT SYNERGY DES 2.50X8.  Post intervention, there is a 0% residual stenosis.  Mid LM lesion is 30% stenosed.  Prox RCA lesion is 70% stenosed.  1.  Anterior ST elevation myocardial infarction 2.  Occluded proximal LAD 3.  Preserved left ventricular function 4.  Successful primary PCI with overlapping 3,0 x 28 mm and 2.5 x 8 mm Synergy stents proximal/mid LAD Recommendations 1.  Dual antiplatelet therapy uninterrupted for 1 year 2.  High intensity atorvastatin 3.  Start metoprolol tartrate 25 mg twice daily 4.  2D echocardiogram 5.  Strongly advised patient to stop smoking     Assessment/Plan    Acute ST elevation myocardial infarction (STEMI) involving left anterior descending (LAD) coronary artery (HCC) Status post cath -Patient is status post cardiac cath with stents to proximal LAD -Plan, per Dr. Darrold Junker as below -Continue amiodarone infusion per cardiology -Metoprolol 25 twice  daily, Lipitor 80 daily -Echocardiogram in the a.m. -Cardiology consult for additional recommendations    DVT prophylaxis: Lovenox  Code Status: full code  Family Communication:  none  Disposition Plan: Back to previous home environment Consults called: Cardiology Status:At the time of admission, it appears that the appropriate admission status for this patient is INPATIENT. This is judged to be reasonable and necessary in order to provide the required intensity of service to ensure the patient's safety given the presenting symptoms, physical exam findings, and initial radiographic and laboratory data in the context of their  Comorbid conditions.   Patient requires inpatient status due to high intensity of service, high risk for further deterioration and high frequency of surveillance required.   I certify that at the point of admission it is my clinical judgment  that the patient will require inpatient hospital care spanning beyond 2 midnights     Andris Baumann MD [facility]     11/08/2020, 12:13 AM

## 2020-11-09 DIAGNOSIS — D72829 Elevated white blood cell count, unspecified: Secondary | ICD-10-CM

## 2020-11-09 DIAGNOSIS — I255 Ischemic cardiomyopathy: Secondary | ICD-10-CM

## 2020-11-09 DIAGNOSIS — F17210 Nicotine dependence, cigarettes, uncomplicated: Secondary | ICD-10-CM

## 2020-11-09 LAB — BASIC METABOLIC PANEL
Anion gap: 10 (ref 5–15)
BUN: 16 mg/dL (ref 6–20)
CO2: 21 mmol/L — ABNORMAL LOW (ref 22–32)
Calcium: 8.7 mg/dL — ABNORMAL LOW (ref 8.9–10.3)
Chloride: 103 mmol/L (ref 98–111)
Creatinine, Ser: 1.15 mg/dL (ref 0.61–1.24)
GFR, Estimated: >60 mL/min (ref >60)
Glucose, Bld: 124 mg/dL — ABNORMAL HIGH (ref 70–99)
Potassium: 4 mmol/L (ref 3.5–5.1)
Sodium: 134 mmol/L — ABNORMAL LOW (ref 135–145)

## 2020-11-09 MED ORDER — METOPROLOL SUCCINATE ER 25 MG PO TB24
12.5000 mg | ORAL_TABLET | Freq: Every day | ORAL | Status: DC
  Administered 2020-11-09 – 2020-11-12 (×4): 12.5 mg via ORAL
  Filled 2020-11-09 (×4): qty 1

## 2020-11-09 MED ORDER — LISINOPRIL 5 MG PO TABS
2.5000 mg | ORAL_TABLET | Freq: Every day | ORAL | Status: DC
  Administered 2020-11-10 – 2020-11-12 (×3): 2.5 mg via ORAL
  Filled 2020-11-09 (×3): qty 1

## 2020-11-09 NOTE — Progress Notes (Signed)
PROGRESS NOTE    Timothy Schultz  YNW:[id number] DOB: 1961-02-03 DOA: 11/07/2020 PCP: No primary care provider on file.    Brief Narrative:  Tish FredericksonKenneth L Schultz a 59 y.o.malewithno significant past medical history who presented earlier by EMS as code STEMI. In route he had a seizure-like activity and was found to be in V. fib and was shocked. 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 He received 3 shocks during cath for V. tach/V. fib and was given an amiodarone bolus and placed on an amiodarone drip. Transferred out of stepdown unit to PCU.  CC: STEMI  12/12- bp low, hypotensive at rest.     Consultants:   cardiology  Procedures: cath  Antimicrobials:       Subjective: No cp or sob. No dizziness at rest  Objective: Vitals:   11/09/20 0727 11/09/20 0825 11/09/20 1026 11/09/20 1146  BP: 96/63  (!) 95/57 95/71  Pulse: 100   100  Resp: 19 (!) 21 (!) 22 19  Temp: 99.5 F (37.5 C)   99.3 F (37.4 C)  TempSrc: Oral   Oral  SpO2: 94%   93%  Weight:      Height:        Intake/Output Summary (Last 24 hours) at 11/09/2020 1427 Last data filed at 11/09/2020 1418 Gross per 24 hour  Intake 600 ml  Output 1100 ml  Net -500 ml   Filed Weights   11/07/20 2217 11/08/20 0028 11/09/20 0511  Weight: 79.4 kg 78.1 kg (P) 77.4 kg    Examination:  General exam: Appears calm and comfortable  Respiratory system: Clear to auscultation. Respiratory effort normal. Cardiovascular system: S1 & S2 heard, RRR. No JVD, murmurs, rubs, gallops or clicks.  Gastrointestinal system: Abdomen is nondistended, soft and nontender.Normal bowel sounds heard. Central nervous system: Alert and oriented.  Grossly intact Extremities: No edema Skin: Warm dry Psychiatry: Judgement and insight appear normal. Mood & affect appropriate.     Data Reviewed: I have personally reviewed following labs and  imaging studies  CBC: Recent Labs  Lab 11/07/20 2220 11/08/20 0542  WBC 14.5* 13.7*  NEUTROABS 6.2  --   HGB 13.3 14.0  HCT 40.1 40.1  MCV 91.6 89.5  PLT 302 264   Basic Metabolic Panel: Recent Labs  Lab 11/07/20 2220 11/08/20 0542 11/09/20 0419  NA 137 135 134*  K 3.3* 3.9 4.0  CL 104 106 103  CO2 17* 18* 21*  GLUCOSE 189* 130* 124*  BUN 15 16 16   CREATININE 1.52* 1.22 1.15  CALCIUM 8.4* 8.9 8.7*   GFR: Estimated Creatinine Clearance: 74.6 mL/min (by C-G formula based on SCr of 1.15 mg/dL). Liver Function Tests: No results for input(s): AST, ALT, ALKPHOS, BILITOT, PROT, ALBUMIN in the last 168 hours. No results for input(s): LIPASE, AMYLASE in the last 168 hours. No results for input(s): AMMONIA in the last 168 hours. Coagulation Profile: No results for input(s): INR, PROTIME in the last 168 hours. Cardiac Enzymes: No results for input(s): CKTOTAL, CKMB, CKMBINDEX, TROPONINI in the last 168 hours. BNP (last 3 results) No results for input(s): PROBNP in the last 8760 hours. HbA1C: No results for input(s): HGBA1C in the last 72 hours. CBG: Recent Labs  Lab 11/08/20 0010  GLUCAP 205*   Lipid Profile: Recent Labs    11/08/20 0542  CHOL 192  HDL 42  LDLCALC 130*  TRIG 99  CHOLHDL 4.6   Thyroid Function Tests: No results for input(s): TSH, T4TOTAL, FREET4, T3FREE, THYROIDAB in the last 72 hours. Anemia Panel: No results for input(s): VITAMINB12, FOLATE, FERRITIN, TIBC, IRON, RETICCTPCT in the last 72 hours. Sepsis Labs: No results for input(s): PROCALCITON, LATICACIDVEN in the last 168 hours.  Recent Results (from the past 240 hour(s))  MRSA PCR Screening     Status: Abnormal   Collection Time: 11/07/20 10:13 PM   Specimen: Nasopharyngeal  Result Value Ref Range Status   MRSA by PCR POSITIVE (A) NEGATIVE Final    Comment:        The GeneXpert MRSA Assay (FDA approved for NASAL specimens only), is one component of a comprehensive MRSA  colonization surveillance program. It is not intended to diagnose MRSA infection nor to guide or monitor treatment for MRSA infections. RESULT CALLED TO, READ BACK BY AND VERIFIED WITH: [facility] CAMPBELL @0337  11/08/2020 TTG Performed at [facility], [address] [place], [place]   Resp Panel by RT-PCR (Flu A&B, Covid) Nasopharyngeal Swab     Status: None   Collection Time: 11/07/20 10:20 PM   Specimen: Nasopharyngeal Swab; Nasopharyngeal(NP) swabs in vial transport medium  Result Value Ref Range Status   SARS Coronavirus 2 by RT PCR NEGATIVE NEGATIVE Final    Comment: (NOTE) SARS-CoV-2 target nucleic acids are NOT DETECTED.  The SARS-CoV-2 RNA is generally detectable in upper respiratory specimens during the acute phase of infection. The lowest concentration of SARS-CoV-2 viral copies this assay can detect is 138 copies/mL. A negative result does not preclude SARS-Cov-2 infection and should not be used as the sole basis for treatment or other patient management decisions. A negative result may occur with  improper specimen collection/handling, submission of specimen other than nasopharyngeal swab, presence of viral mutation(s) within the areas targeted by this assay, and inadequate number of viral copies(<138 copies/mL). A negative result must be combined with clinical observations, patient history, and epidemiological information. The expected result is Negative.  Fact Sheet for Patients:  14/10/21  Fact Sheet for Healthcare Providers:  BloggerCourse.com  This test is no t yet approved or cleared by the [employer]ious[employer] FDA and  has been authorized for detection and/or diagnosis of SARS-CoV-2 by FDA under an Emergency Use Authorization (EUA). This EUA will remain  in effect (meaning this test can be used) for the duration of the COVID-19 declaration under Section 564(b)(1) of the Act,  21 U.S.C.section 360bbb-3(b)(1), unless the authorization is terminated  or revoked sooner.       Influenza A by PCR NEGATIVE NEGATIVE Final   Influenza B by PCR NEGATIVE NEGATIVE Final    Comment: (NOTE) The Xpert Xpress SARS-CoV-2/FLU/RSV plus assay is intended as an aid in the diagnosis of influenza from Nasopharyngeal swab specimens and should not be used as a sole basis for treatment. Nasal washings and aspirates are unacceptable for Xpert Xpress SARS-CoV-2/FLU/RSV testing.  Fact Sheet for Patients: [place]  Fact Sheet for Healthcare Providers: BloggerCourse.com  This test is not yet approved or cleared by the [employer]ious[employer]ker.[employer] FDA and has been authorized for detection and/or diagnosis of SARS-CoV-2 by FDA under an Emergency Use Authorization (EUA). This EUA will remain in effect (meaning this test can be used) for the duration of the COVID-19 declaration under Section 564(b)(1) of the Act, 21 U.S.C. section 360bbb-3(b)(1), unless the authorization is terminated or revoked.  Performed at [facility], [address], [place], [place]  Radiology Studies: CARDIAC CATHETERIZATION  Result Date: 11/08/2020  Prox LAD to Mid LAD lesion is 100% stenosed.  2nd Diag lesion is 70% stenosed.  1st Mrg lesion is 75% stenosed.  Prox Cx to Mid Cx lesion is 60% stenosed.  A drug-eluting stent was successfully placed using a STENT SYNERGY DES 3X28.  Post intervention, there is a 0% residual stenosis.  Mid LAD-1 lesion is 70% stenosed.  Mid LAD-2 lesion is 40% stenosed.  A drug-eluting stent was successfully placed using a STENT SYNERGY DES 2.50X8.  Post intervention, there is a 0% residual stenosis.  Mid LM lesion is 30% stenosed.  Prox RCA lesion is 70% stenosed.  1.  Anterior ST elevation myocardial infarction 2.  Occluded proximal LAD 3.  Preserved left ventricular function 4.  Successful  primary PCI with overlapping 3,0 x 28 mm and 2.5 x 8 mm Synergy stents proximal/mid LAD Recommendations 1.  Dual antiplatelet therapy uninterrupted for 1 year 2.  High intensity atorvastatin 3.  Start metoprolol tartrate 25 mg twice daily 4.  2D echocardiogram 5.  Strongly advised patient to stop smoking   ECHOCARDIOGRAM COMPLETE  Result Date: 11/08/2020    ECHOCARDIOGRAM REPORT   Patient Name:   JEN EPPINGER [facility] Date of Exam: 11/08/2020 Medical Rec #:  [id number]        Height:       71.0 in Accession #:    [id number]       Weight:       172.2 lb Date of Birth:  1961-02-01        BSA:          1.979 m Patient Age:    59 years         BP:           119/105 mmHg Patient Gender: M                HR:           90 bpm. Exam Location:  [facility] Procedure: 2D Echo Indications:     Chest Pain R07.9  History:         Patient has no prior history of Echocardiogram examinations.  Sonographer:     Wonda Cerise RDCS Referring Phys:  [id number] [facility] Diagnosing Phys: Marcina Millard MD  Sonographer Comments: Technically difficult study due to poor echo windows and suboptimal parasternal window. Image acquisition challenging due to respiratory motion. IMPRESSIONS  1. Left ventricular ejection fraction, by estimation, is 30 to 35%. The left ventricle has moderately decreased function. The left ventricle demonstrates regional wall motion abnormalities (see scoring diagram/findings for description). Left ventricular  diastolic parameters were normal.  2. Right ventricular systolic function is normal. The right ventricular size is normal.  3. The mitral valve is normal in structure. Mild mitral valve regurgitation. No evidence of mitral stenosis.  4. The aortic valve is normal in structure. Aortic valve regurgitation is not visualized. No aortic stenosis is present.  5. The inferior vena cava is normal in size with greater than 50% respiratory variability, suggesting right atrial pressure of 3 mmHg. FINDINGS  Left  Ventricle: Left ventricular ejection fraction, by estimation, is 30 to 35%. The left ventricle has moderately decreased function. The left ventricle demonstrates regional wall motion abnormalities. The left ventricular internal cavity size was normal in size. There is no left ventricular hypertrophy. Left ventricular diastolic parameters were normal.  LV Wall Scoring: The apical lateral segment, apical anterior segment, and apex are akinetic. The apical  septal segment is hypokinetic. Right Ventricle: The right ventricular size is normal. No increase in right ventricular wall thickness. Right ventricular systolic function is normal. Left Atrium: Left atrial size was normal in size. Right Atrium: Right atrial size was normal in size. Pericardium: There is no evidence of pericardial effusion. Mitral Valve: The mitral valve is normal in structure. Mild mitral valve regurgitation. No evidence of mitral valve stenosis. Tricuspid Valve: The tricuspid valve is normal in structure. Tricuspid valve regurgitation is mild . No evidence of tricuspid stenosis. Aortic Valve: The aortic valve is normal in structure. Aortic valve regurgitation is not visualized. No aortic stenosis is present. Pulmonic Valve: The pulmonic valve was normal in structure. Pulmonic valve regurgitation is not visualized. No evidence of pulmonic stenosis. Aorta: The aortic root is normal in size and structure. Venous: The inferior vena cava is normal in size with greater than 50% respiratory variability, suggesting right atrial pressure of 3 mmHg. IAS/Shunts: No atrial level shunt detected by color flow Doppler.  LEFT VENTRICLE PLAX 2D LVIDd:         5.21 cm      Diastology LVIDs:         4.47 cm      LV e' medial:    7.94 cm/s LV PW:         0.99 cm      LV E/e' medial:  9.4 LV IVS:        1.12 cm      LV e' lateral:   8.16 cm/s LVOT diam:     1.90 cm      LV E/e' lateral: 9.1 LV SV:         25 LV SV Index:   13 LVOT Area:     2.84 cm  LV Volumes (MOD)  LV vol d, MOD A4C: 105.0 ml LV vol s, MOD A4C: 67.6 ml LV SV MOD A4C:     105.0 ml RIGHT VENTRICLE RV Basal diam:  2.23 cm RV S prime:     10.30 cm/s TAPSE (M-mode): 1.8 cm LEFT ATRIUM             Index      RIGHT ATRIUM          Index LA diam:        2.90 cm 1.47 cm/m RA Area:     7.62 cm LA Vol (A2C):   14.1 ml 7.13 ml/m RA Volume:   14.10 ml 7.13 ml/m LA Vol (A4C):   17.0 ml 8.59 ml/m LA Biplane Vol: 16.0 ml 8.09 ml/m  AORTIC VALVE LVOT Vmax:   56.10 cm/s LVOT Vmean:  36.200 cm/s LVOT VTI:    0.089 m  AORTA Ao Root diam: 2.50 cm MITRAL VALVE MV Area (PHT): 7.09 cm    SHUNTS MV Decel Time: 107 msec    Systemic VTI:  0.09 m MV E velocity: 74.60 cm/s  Systemic Diam: 1.90 cm MV A velocity: 74.60 cm/s MV E/A ratio:  1.00 Marcina Millard MD Electronically signed by Marcina Millard MD Signature Date/Time: 11/08/2020/5:05:26 PM    Final         Scheduled Meds: . aspirin EC  81 mg Oral Daily  . atorvastatin  80 mg Oral Daily  . buprenorphine-naloxone  1 tablet Sublingual Daily  . Chlorhexidine Gluconate Cloth  6 each Topical Q0600  . Chlorhexidine Gluconate Cloth  6 each Topical Q0600  . enoxaparin (LOVENOX) injection  40 mg Subcutaneous Q24H  . metoprolol succinate  12.5  mg Oral Daily  . mupirocin ointment  1 application Nasal BID  . sodium chloride flush  3 mL Intravenous Q12H  . ticagrelor  90 mg Oral BID   Continuous Infusions: . sodium chloride      Assessment & Plan:   Active Problems:   Acute ST elevation myocardial infarction (STEMI) involving left anterior descending (LAD) coronary artery (HCC)   STEMI (ST elevation myocardial infarction) (HCC)   Acute ST elevation myocardial infarction (STEMI) involving left anterior descending (LAD) coronary artery (HCC) Status post cath -s/p stents to proximal LAD -Cardiology following Aspirin and Brilinta for 1 year uninterrupted Continue statin Echo found with EF 30 to 35% Patient hypotensive at rest unsure if he becomes  symptomatic with ambulation.  Unable to give beta-blockers.  Will change metoprolol to Toprol-XL since with cardiomyopathy. Start Toprol-XL 12.5 mg twice daily increase as tolerated if blood pressure allows   Ischemic cardiomyopathy Status post STEMI Echo with EF 30 to 35% Currently euvolemic on exam Toprol to Toprol-XL 12.5 mg twice daily increase as tolerated BP does not allow addition of other CM medications Will need reevaluation of LV function as outpatient with a repeat echo in near future post PCI Cards following  Tobacco smoker- Patient was counseled extensively about risk and complications of ongoing smoking and encouraged to quit Offered nicotine patch however he declined  Leukocytosis-likely stress-induced/reactive Afebrile We will continue to follow  DVT prophylaxis: Lovenox Code Status: Full Family Communication: None at bedside  Status is: Inpatient  Remains inpatient appropriate because:Hemodynamically unstable   Dispo: The patient is from: Home              Anticipated d/c is to: Home              Anticipated d/c date is: 1 day              Patient currently is not medically stable to d/c.Hypotensive, adjusting medications.             LOS: 2 days   Time spent: 45 minutes with more than 50% on COC    Lynn Ito, MD [facility] Pager [phone]  If 7PM-7AM, please contact night-coverage 11/09/2020, 2:27 PM

## 2020-11-09 NOTE — Progress Notes (Signed)
[facility] Cardiology  SUBJECTIVE: Patient laying in bed, ports feeling much better, denies chest pain or shortness of breath   Vitals:   11/09/20 0825 11/09/20 1026 11/09/20 1146 11/09/20 1432  BP:  (!) 95/57 95/71 90/62   Pulse:   100 92  Resp: (!) 21 (!) 22 19 19   Temp:   99.3 F (37.4 C) 98.8 F (37.1 C)  TempSrc:   Oral Oral  SpO2:   93% 93%  Weight:      Height:         Intake/Output Summary (Last 24 hours) at 11/09/2020 1453 Last data filed at 11/09/2020 1418 Gross per 24 hour  Intake 600 ml  Output 1100 ml  Net -500 ml      PHYSICAL EXAM  General: Well developed, well nourished, in no acute distress HEENT:  Normocephalic and atramatic Neck:  No JVD.  Lungs: Clear bilaterally to auscultation and percussion. Heart: HRRR . Normal S1 and S2 without gallops or murmurs.  Abdomen: Bowel sounds are positive, abdomen soft and non-tender  Msk:  Back normal, normal gait. Normal strength and tone for age. Extremities: No clubbing, cyanosis or edema.   Neuro: Alert and oriented X 3. Psych:  Good affect, responds appropriately   LABS: Basic Metabolic Panel: Recent Labs    11/08/20 0542 11/09/20 0419  NA 135 134*  K 3.9 4.0  CL 106 103  CO2 18* 21*  GLUCOSE 130* 124*  BUN 16 16  CREATININE 1.22 1.15  CALCIUM 8.9 8.7*   Liver Function Tests: No results for input(s): AST, ALT, ALKPHOS, BILITOT, PROT, ALBUMIN in the last 72 hours. No results for input(s): LIPASE, AMYLASE in the last 72 hours. CBC: Recent Labs    11/07/20 2220 11/08/20 0542  WBC 14.5* 13.7*  NEUTROABS 6.2  --   HGB 13.3 14.0  HCT 40.1 40.1  MCV 91.6 89.5  PLT 302 264   Cardiac Enzymes: No results for input(s): CKTOTAL, CKMB, CKMBINDEX, TROPONINI in the last 72 hours. BNP: Invalid input(s): POCBNP D-Dimer: No results for input(s): DDIMER in the last 72 hours. Hemoglobin A1C: No results for input(s): HGBA1C in the last 72 hours. Fasting Lipid Panel: Recent Labs    11/08/20 0542  CHOL  192  HDL 42  LDLCALC 130*  TRIG 99  CHOLHDL 4.6   Thyroid Function Tests: No results for input(s): TSH, T4TOTAL, T3FREE, THYROIDAB in the last 72 hours.  Invalid input(s): FREET3 Anemia Panel: No results for input(s): VITAMINB12, FOLATE, FERRITIN, TIBC, IRON, RETICCTPCT in the last 72 hours.  CARDIAC CATHETERIZATION  Result Date: 11/08/2020  Prox LAD to Mid LAD lesion is 100% stenosed.  2nd Diag lesion is 70% stenosed.  1st Mrg lesion is 75% stenosed.  Prox Cx to Mid Cx lesion is 60% stenosed.  A drug-eluting stent was successfully placed using a STENT SYNERGY DES 3X28.  Post intervention, there is a 0% residual stenosis.  Mid LAD-1 lesion is 70% stenosed.  Mid LAD-2 lesion is 40% stenosed.  A drug-eluting stent was successfully placed using a STENT SYNERGY DES 2.50X8.  Post intervention, there is a 0% residual stenosis.  Mid LM lesion is 30% stenosed.  Prox RCA lesion is 70% stenosed.  1.  Anterior ST elevation myocardial infarction 2.  Occluded proximal LAD 3.  Preserved left ventricular function 4.  Successful primary PCI with overlapping 3,0 x 28 mm and 2.5 x 8 mm Synergy stents proximal/mid LAD Recommendations 1.  Dual antiplatelet therapy uninterrupted for 1 year 2.  High intensity  atorvastatin 3.  Start metoprolol tartrate 25 mg twice daily 4.  2D echocardiogram 5.  Strongly advised patient to stop smoking   ECHOCARDIOGRAM COMPLETE  Result Date: 11/08/2020    ECHOCARDIOGRAM REPORT   Patient Name:   Timothy Schultz [facility] Date of Exam: 11/08/2020 Medical Rec #:  [id number]        Height:       71.0 in Accession #:    [id number]       Weight:       172.2 lb Date of Birth:  1961/05/17        BSA:          1.979 m Patient Age:    59 years         BP:           119/105 mmHg Patient Gender: M                HR:           90 bpm. Exam Location:  [facility] Procedure: 2D Echo Indications:     Chest Pain R07.9  History:         Patient has no prior history of Echocardiogram examinations.   Sonographer:     Wonda Cerise RDCS Referring Phys:  [id number] [facility] AMERY Diagnosing Phys: Marcina Millard MD  Sonographer Comments: Technically difficult study due to poor echo windows and suboptimal parasternal window. Image acquisition challenging due to respiratory motion. IMPRESSIONS  1. Left ventricular ejection fraction, by estimation, is 30 to 35%. The left ventricle has moderately decreased function. The left ventricle demonstrates regional wall motion abnormalities (see scoring diagram/findings for description). Left ventricular  diastolic parameters were normal.  2. Right ventricular systolic function is normal. The right ventricular size is normal.  3. The mitral valve is normal in structure. Mild mitral valve regurgitation. No evidence of mitral stenosis.  4. The aortic valve is normal in structure. Aortic valve regurgitation is not visualized. No aortic stenosis is present.  5. The inferior vena cava is normal in size with greater than 50% respiratory variability, suggesting right atrial pressure of 3 mmHg. FINDINGS  Left Ventricle: Left ventricular ejection fraction, by estimation, is 30 to 35%. The left ventricle has moderately decreased function. The left ventricle demonstrates regional wall motion abnormalities. The left ventricular internal cavity size was normal in size. There is no left ventricular hypertrophy. Left ventricular diastolic parameters were normal.  LV Wall Scoring: The apical lateral segment, apical anterior segment, and apex are akinetic. The apical septal segment is hypokinetic. Right Ventricle: The right ventricular size is normal. No increase in right ventricular wall thickness. Right ventricular systolic function is normal. Left Atrium: Left atrial size was normal in size. Right Atrium: Right atrial size was normal in size. Pericardium: There is no evidence of pericardial effusion. Mitral Valve: The mitral valve is normal in structure. Mild mitral valve regurgitation. No  evidence of mitral valve stenosis. Tricuspid Valve: The tricuspid valve is normal in structure. Tricuspid valve regurgitation is mild . No evidence of tricuspid stenosis. Aortic Valve: The aortic valve is normal in structure. Aortic valve regurgitation is not visualized. No aortic stenosis is present. Pulmonic Valve: The pulmonic valve was normal in structure. Pulmonic valve regurgitation is not visualized. No evidence of pulmonic stenosis. Aorta: The aortic root is normal in size and structure. Venous: The inferior vena cava is normal in size with greater than 50% respiratory variability, suggesting right atrial pressure of 3 mmHg. IAS/Shunts: No atrial level shunt  detected by color flow Doppler.  LEFT VENTRICLE PLAX 2D LVIDd:         5.21 cm      Diastology LVIDs:         4.47 cm      LV e' medial:    7.94 cm/s LV PW:         0.99 cm      LV E/e' medial:  9.4 LV IVS:        1.12 cm      LV e' lateral:   8.16 cm/s LVOT diam:     1.90 cm      LV E/e' lateral: 9.1 LV SV:         25 LV SV Index:   13 LVOT Area:     2.84 cm  LV Volumes (MOD) LV vol d, MOD A4C: 105.0 ml LV vol s, MOD A4C: 67.6 ml LV SV MOD A4C:     105.0 ml RIGHT VENTRICLE RV Basal diam:  2.23 cm RV S prime:     10.30 cm/s TAPSE (M-mode): 1.8 cm LEFT ATRIUM             Index      RIGHT ATRIUM          Index LA diam:        2.90 cm 1.47 cm/m RA Area:     7.62 cm LA Vol (A2C):   14.1 ml 7.13 ml/m RA Volume:   14.10 ml 7.13 ml/m LA Vol (A4C):   17.0 ml 8.59 ml/m LA Biplane Vol: 16.0 ml 8.09 ml/m  AORTIC VALVE LVOT Vmax:   56.10 cm/s LVOT Vmean:  36.200 cm/s LVOT VTI:    0.089 m  AORTA Ao Root diam: 2.50 cm MITRAL VALVE MV Area (PHT): 7.09 cm    SHUNTS MV Decel Time: 107 msec    Systemic VTI:  0.09 m MV E velocity: 74.60 cm/s  Systemic Diam: 1.90 cm MV A velocity: 74.60 cm/s MV E/A ratio:  1.00 Marcina Millard MD Electronically signed by Marcina Millard MD Signature Date/Time: 11/08/2020/5:05:26 PM    Final      Echo LVEF 30 to  35%  TELEMETRY: Sinus rhythm:  ASSESSMENT AND PLAN:  Active Problems:   Acute ST elevation myocardial infarction (STEMI) involving left anterior descending (LAD) coronary artery (HCC)   STEMI (ST elevation myocardial infarction) (HCC)    1.  Anterior STEMI, status post primary PCI with overlapping DES proximal LAD 2.  Ischemic cardiomyopathy, LVEF 30 to 35%, no evidence of congestive heart failure 3.  Tobacco abuse 4.  EtOH abuse  Recommendations  1.  Dual antiplatelet therapy uninterrupted for 1 year 2.  Continue metoprolol succinate 12.5 mg daily 3.  Add low-dose lisinopril 2.5 mg daily 4.  Continue high intensity atorvastatin 80 mg daily 5.  Probable discharge to home in a.m. 6.  Follow-up with me 1 to 2 weeks after discharge   Marcina Millard, MD, PhD, [facility] 11/09/2020 2:53 PM

## 2020-11-09 NOTE — Progress Notes (Signed)
Made Dr. Marylu Lund aware patients blood pressure is 95/57 heart rate 98-104. Per md hold scheduled metoprolol 25mg , she will address dose. Will continue to monitor

## 2020-11-09 NOTE — Progress Notes (Signed)
Per Dr. Darrold Junker, okay to hold lisinopril, if blood pressure improves okay to give scheduld dose of metoprolol. Rechecked bp 92/64 map 73. Scheduled metoprolol 12.5 mg po given. Will continue to monitor.

## 2020-11-10 ENCOUNTER — Encounter: Payer: Self-pay | Admitting: Cardiology

## 2020-11-10 ENCOUNTER — Inpatient Hospital Stay: Payer: 59

## 2020-11-10 DIAGNOSIS — R509 Fever, unspecified: Secondary | ICD-10-CM

## 2020-11-10 DIAGNOSIS — J690 Pneumonitis due to inhalation of food and vomit: Secondary | ICD-10-CM

## 2020-11-10 DIAGNOSIS — F17200 Nicotine dependence, unspecified, uncomplicated: Secondary | ICD-10-CM

## 2020-11-10 LAB — URINALYSIS, COMPLETE (UACMP) WITH MICROSCOPIC
Bacteria, UA: NONE SEEN
Bilirubin Urine: NEGATIVE
Glucose, UA: NEGATIVE mg/dL
Ketones, ur: NEGATIVE mg/dL
Leukocytes,Ua: NEGATIVE
Nitrite: NEGATIVE
Protein, ur: 30 mg/dL — AB
Specific Gravity, Urine: 1.024 (ref 1.005–1.030)
pH: 7 (ref 5.0–8.0)

## 2020-11-10 LAB — CBC
HCT: 35.8 % — ABNORMAL LOW (ref 39.0–52.0)
Hemoglobin: 12.7 g/dL — ABNORMAL LOW (ref 13.0–17.0)
MCH: 31.5 pg (ref 26.0–34.0)
MCHC: 35.5 g/dL (ref 30.0–36.0)
MCV: 88.8 fL (ref 80.0–100.0)
Platelets: 195 10*3/uL (ref 150–400)
RBC: 4.03 MIL/uL — ABNORMAL LOW (ref 4.22–5.81)
RDW: 13.2 % (ref 11.5–15.5)
WBC: 16.4 10*3/uL — ABNORMAL HIGH (ref 4.0–10.5)
nRBC: 0 % (ref 0.0–0.2)

## 2020-11-10 LAB — MAGNESIUM: Magnesium: 2.1 mg/dL (ref 1.7–2.4)

## 2020-11-10 MED ORDER — SODIUM CHLORIDE 0.9 % IV SOLN
3.0000 g | Freq: Four times a day (QID) | INTRAVENOUS | Status: DC
  Administered 2020-11-10 – 2020-11-13 (×11): 3 g via INTRAVENOUS
  Filled 2020-11-10: qty 0.18
  Filled 2020-11-10 (×5): qty 8
  Filled 2020-11-10: qty 3
  Filled 2020-11-10: qty 8
  Filled 2020-11-10: qty 3
  Filled 2020-11-10 (×5): qty 8
  Filled 2020-11-10: qty 3

## 2020-11-10 NOTE — Plan of Care (Signed)

## 2020-11-10 NOTE — Consult Note (Signed)
Pharmacy Antibiotic Note  Timothy Schultz is a 59 y.o. male admitted on 11/07/2020 with pneumonia.  Pharmacy has been consulted for Unasyn dosing.  Plan: This patient's current antibiotics will be continued without adjustments.  Will continue Unasyn 3gm q6hrs as ordered.  Height: 5\' 11"  (180.3 cm) Weight: 77 kg (169 lb 12.8 oz) IBW/kg (Calculated) : 75.3  CrCl:74.66ml/min  Thank you for allowing pharmacy to be a part of this patient's care.  11m 11/10/2020 2:31 PM

## 2020-11-10 NOTE — Consult Note (Addendum)
° °  Heart Failure Nurse Navigator Note  HFrEF 30-35 %  He had called EMS due to worsening chest discomfort.  Experienced out of hospital cardiac arrest and was taken directly to the cardiac catheterization lab he received lapping drug-eluting stents to the proximal LAD.    Comorbidities:  Tobacco abuse    Medications:  Aspirin 81 mg daily Atorvastatin 80 mg daily Lisinopril 2.5 mg daily Metoprolol succinate 12.5 daily Brilinta 90 mg twice a day   Labs:  Magnesium 2.1, WBC 16.4, Hemoglobin 12.7, hematocrit 35.8, from yesterday sodium 134, potassium 4, chloride 103, CO2 21, BUN 16 and creatinine 1.15. Weight is 77 kg Blood pressure 104/75 BMI 23.68  Intake 243 mL Output 150 mL    Assessment  General-he is awake and alert lying in bed watching TV in no acute distress.   HEENT-normocephalic, no JVD.  Cardiac-heart tones are regular rate and rhythm  Chest-breath sounds are clear to posterior auscultation.  Abdomen-soft nontender  Musculoskeletal no lower extremity edema.  Psych-is pleasant and appropriate, makes good eye contact.  Neurologic-speech is clear, he moves all extremities without difficulty.      Discussed heart failure with patient.  Explained what is going on with his heart, went over medications and there importance.  Discussed low-sodium diet, fluid restriction, importance of weighing daily and reporting weight gain to his position.  Discussed symptoms to report to physician.  Also discussed abstaining from tobacco.  He voices understanding he states that this time he really does not miss it but is not sure what will happen when he gets home.  Given the heart failure teaching booklet along with his own magnet.  Viewed the heart failure videos.  Discussed also the importance of following up in the heart failure clinic, he has an appointment for December 21 at 9 AM.   Tresa Endo RN, [facility]

## 2020-11-10 NOTE — Progress Notes (Signed)
[facility] Cardiology    SUBJECTIVE: The patient reports feeling "great" with no recurrent chest pain and with better breathing.   Vitals:   11/09/20 2350 11/10/20 0322 11/10/20 0500 11/10/20 0741  BP:  97/67  96/74  Pulse:  91  95  Resp:  17  18  Temp: 98.4 F (36.9 C) 98.5 F (36.9 C)  98 F (36.7 C)  TempSrc: Oral Oral  Oral  SpO2:  96%  96%  Weight:   77 kg   Height:         Intake/Output Summary (Last 24 hours) at 11/10/2020 9326 Last data filed at 11/09/2020 2049 Gross per 24 hour  Intake 243 ml  Output 150 ml  Net 93 ml      PHYSICAL EXAM  General: Well developed, well nourished, in no acute distress HEENT:  Normocephalic and atramatic Neck:  No JVD.  Lungs: Clear bilaterally to auscultation, normal effort of breathing on room air. Heart: HRRR . Normal S1 and S2 without gallops or murmurs.  Abdomen: nondistended Msk:  Back normal, normal gait. Normal strength and tone for age. Extremities: No clubbing, cyanosis or edema.   Neuro: Alert and oriented X 3. Psych:  Good affect, responds appropriately   LABS: Basic Metabolic Panel: Recent Labs    11/08/20 0542 11/09/20 0419 11/10/20 0554  NA 135 134*  --   K 3.9 4.0  --   CL 106 103  --   CO2 18* 21*  --   GLUCOSE 130* 124*  --   BUN 16 16  --   CREATININE 1.22 1.15  --   CALCIUM 8.9 8.7*  --   MG  --   --  2.1   Liver Function Tests: No results for input(s): AST, ALT, ALKPHOS, BILITOT, PROT, ALBUMIN in the last 72 hours. No results for input(s): LIPASE, AMYLASE in the last 72 hours. CBC: Recent Labs    11/07/20 2220 11/08/20 0542 11/10/20 0554  WBC 14.5* 13.7* 16.4*  NEUTROABS 6.2  --   --   HGB 13.3 14.0 12.7*  HCT 40.1 40.1 35.8*  MCV 91.6 89.5 88.8  PLT 302 264 195   Cardiac Enzymes: No results for input(s): CKTOTAL, CKMB, CKMBINDEX, TROPONINI in the last 72 hours. BNP: Invalid input(s): POCBNP D-Dimer: No results for input(s): DDIMER in the last 72 hours. Hemoglobin A1C: No results  for input(s): HGBA1C in the last 72 hours. Fasting Lipid Panel: Recent Labs    11/08/20 0542  CHOL 192  HDL 42  LDLCALC 130*  TRIG 99  CHOLHDL 4.6   Thyroid Function Tests: No results for input(s): TSH, T4TOTAL, T3FREE, THYROIDAB in the last 72 hours.  Invalid input(s): FREET3 Anemia Panel: No results for input(s): VITAMINB12, FOLATE, FERRITIN, TIBC, IRON, RETICCTPCT in the last 72 hours.  DG Chest Port 1 View  Result Date: 11/10/2020 CLINICAL DATA:  Fever beginning today. EXAM: PORTABLE CHEST 1 VIEW COMPARISON:  10/08/2011 FINDINGS: Heart size is normal. Mediastinal shadows are normal except for some aortic atherosclerosis. Hazy pulmonary infiltrates in the mid lungs, right more than left, consistent with bronchopneumonia. No dense consolidation, collapse or effusion. IMPRESSION: Hazy pulmonary infiltrates in the mid lungs, right more than left, consistent with bronchopneumonia. Electronically Signed   By: Paulina Fusi M.D.   On: 11/10/2020 09:24   ECHOCARDIOGRAM COMPLETE  Result Date: 11/08/2020    ECHOCARDIOGRAM REPORT   Patient Name:   CARI VANDEBERG Date of Exam: 11/08/2020 Medical Rec #:  [id number]  Height:       71.0 in Accession #:    [id number]       Weight:       172.2 lb Date of Birth:  [birth year]        BSA:          1.979 m Patient Age:    59 years         BP:           119/105 mmHg Patient Gender: M                HR:           90 bpm. Exam Location:  [facility] Procedure: 2D Echo Indications:     Chest Pain R07.9  History:         Patient has no prior history of Echocardiogram examinations.  Sonographer:     Wonda Cerise RDCS Referring Phys:  [id number] [facility] AMERY Diagnosing Phys: Marcina Millard MD  Sonographer Comments: Technically difficult study due to poor echo windows and suboptimal parasternal window. Image acquisition challenging due to respiratory motion. IMPRESSIONS  1. Left ventricular ejection fraction, by estimation, is 30 to 35%. The left ventricle has  moderately decreased function. The left ventricle demonstrates regional wall motion abnormalities (see scoring diagram/findings for description). Left ventricular  diastolic parameters were normal.  2. Right ventricular systolic function is normal. The right ventricular size is normal.  3. The mitral valve is normal in structure. Mild mitral valve regurgitation. No evidence of mitral stenosis.  4. The aortic valve is normal in structure. Aortic valve regurgitation is not visualized. No aortic stenosis is present.  5. The inferior vena cava is normal in size with greater than 50% respiratory variability, suggesting right atrial pressure of 3 mmHg. FINDINGS  Left Ventricle: Left ventricular ejection fraction, by estimation, is 30 to 35%. The left ventricle has moderately decreased function. The left ventricle demonstrates regional wall motion abnormalities. The left ventricular internal cavity size was normal in size. There is no left ventricular hypertrophy. Left ventricular diastolic parameters were normal.  LV Wall Scoring: The apical lateral segment, apical anterior segment, and apex are akinetic. The apical septal segment is hypokinetic. Right Ventricle: The right ventricular size is normal. No increase in right ventricular wall thickness. Right ventricular systolic function is normal. Left Atrium: Left atrial size was normal in size. Right Atrium: Right atrial size was normal in size. Pericardium: There is no evidence of pericardial effusion. Mitral Valve: The mitral valve is normal in structure. Mild mitral valve regurgitation. No evidence of mitral valve stenosis. Tricuspid Valve: The tricuspid valve is normal in structure. Tricuspid valve regurgitation is mild . No evidence of tricuspid stenosis. Aortic Valve: The aortic valve is normal in structure. Aortic valve regurgitation is not visualized. No aortic stenosis is present. Pulmonic Valve: The pulmonic valve was normal in structure. Pulmonic valve  regurgitation is not visualized. No evidence of pulmonic stenosis. Aorta: The aortic root is normal in size and structure. Venous: The inferior vena cava is normal in size with greater than 50% respiratory variability, suggesting right atrial pressure of 3 mmHg. IAS/Shunts: No atrial level shunt detected by color flow Doppler.  LEFT VENTRICLE PLAX 2D LVIDd:         5.21 cm      Diastology LVIDs:         4.47 cm      LV e' medial:    7.94 cm/s LV PW:  0.99 cm      LV E/e' medial:  9.4 LV IVS:        1.12 cm      LV e' lateral:   8.16 cm/s LVOT diam:     1.90 cm      LV E/e' lateral: 9.1 LV SV:         25 LV SV Index:   13 LVOT Area:     2.84 cm  LV Volumes (MOD) LV vol d, MOD A4C: 105.0 ml LV vol s, MOD A4C: 67.6 ml LV SV MOD A4C:     105.0 ml RIGHT VENTRICLE RV Basal diam:  2.23 cm RV S prime:     10.30 cm/s TAPSE (M-mode): 1.8 cm LEFT ATRIUM             Index      RIGHT ATRIUM          Index LA diam:        2.90 cm 1.47 cm/m RA Area:     7.62 cm LA Vol (A2C):   14.1 ml 7.13 ml/m RA Volume:   14.10 ml 7.13 ml/m LA Vol (A4C):   17.0 ml 8.59 ml/m LA Biplane Vol: 16.0 ml 8.09 ml/m  AORTIC VALVE LVOT Vmax:   56.10 cm/s LVOT Vmean:  36.200 cm/s LVOT VTI:    0.089 m  AORTA Ao Root diam: 2.50 cm MITRAL VALVE MV Area (PHT): 7.09 cm    SHUNTS MV Decel Time: 107 msec    Systemic VTI:  0.09 m MV E velocity: 74.60 cm/s  Systemic Diam: 1.90 cm MV A velocity: 74.60 cm/s MV E/A ratio:  1.00 Marcina MillardAlexander Paraschos MD Electronically signed by Marcina MillardAlexander Paraschos MD Signature Date/Time: 11/08/2020/5:05:26 PM    Final      Echo LVEF 30-35%   TELEMETRY: sinus rhythm  ASSESSMENT AND PLAN:  Active Problems:   Acute ST elevation myocardial infarction (STEMI) involving left anterior descending (LAD) coronary artery (HCC)   STEMI (ST elevation myocardial infarction) (HCC)   1.  Anterior STEMI, status post primary PCI with overlapping DES proximal LAD 2.  Ischemic cardiomyopathy, LVEF 30 to 35%, no evidence of  congestive heart failure 3.  Tobacco abuse 4.  EtOH abuse 5. Fever, on 12/12, labs and chest xray pending  Recommendations: 1. Continue aspirin and Brilinta uninterrupted for 1 year. Patient will need coupon for first month free of Brilinta 2. Continue metoprolol succinate 12.5 mg daily 3. Continue high intensity atorvastatin 4. Continue low dose lisinopril 2.5 mg daily 5. Strongly encouraged patient to stop smoking 6. Plan to repeat BMP at discharge to monitor renal function/electrolytes 7.  Discussed indications for LifeVest with the patient and his daughter, and he agreed to proceed. Order form faxed today at 10:16 AM 8. Heart failure clinic and cardiac rehab phase 2 as outpatient 9. Follow-up with Dr. Darrold JunkerParaschos in 1-2 weeks     Leanora Ivanoffnna Shant Hence, PA-C 11/10/2020 9:27 AM  [facility] Discharge   Aspirin prescribed at discharge:  Yes  High Intensity Statin Prescribed? (Lipitor 40-80mg  or Crestor 20-40mg ): Yes  Beta Blocker Prescribed: Yes  ADP Receptor Inhibitor Prescribed? (i.e. Plavix etc.-Includes Medically Managed Patients): Yes  ACEI/ARB Prescribed? (If NO document contraindications)  Yes (though BP soft, may have to hold)  Aldosterone Inhibitor Prescribed? No: not indicated  Was EF assessed during THIS hospitalization? Yes  (YES = Measured in current episode of care or document plan to evaluate after discharge.)  Was EF < 40% ? Yes  Was Cardiac  Rehab II ordered? (Includes Medically managed Patients): Per hospitalist  Was Smoking Cessation Advice provided?  Yes

## 2020-11-10 NOTE — Progress Notes (Signed)
PROGRESS NOTE    Timothy Schultz  MIW:[id number] DOB: 01/09/1961 DOA: 11/07/2020 PCP: No primary care provider on file.    Brief Narrative:  Timothy Schultz a 59 y.o.malewithno significant past medical history who presented earlier by EMS as code STEMI. In route he had a seizure-like activity and was found to be in V. fib and was shocked. 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 He received 3 shocks during cath for V. tach/V. fib and was given an amiodarone bolus and placed on an amiodarone drip. Transferred out of stepdown unit to PCU.  CC: STEMI  12/12- bp low, hypotensive at rest.    12/13 _febrile last night with leukocytosis. Tmax 100.5 CXR with bronchopneumonia Telemetry sinus tachycardia, sinus rhythm  Consultants:   cardiology  Procedures: cath  Antimicrobials:       Subjective: Daughter at bedside.  Denies chest pain, shortness of breath, palpitations.   Objective: Vitals:   11/10/20 0500 11/10/20 0741 11/10/20 1032 11/10/20 1134  BP:  96/74 96/75 104/75  Pulse:  95 93 92  Resp:  18  19  Temp:  98 F (36.7 C)  98.3 F (36.8 C)  TempSrc:  Oral  Oral  SpO2:  96%  97%  Weight: 77 kg     Height:        Intake/Output Summary (Last 24 hours) at 11/10/2020 1423 Last data filed at 11/10/2020 1026 Gross per 24 hour  Intake 483 ml  Output 125 ml  Net 358 ml   Filed Weights   11/08/20 0028 11/09/20 0511 11/10/20 0500  Weight: 78.1 kg (P) 77.4 kg 77 kg    Examination: Sitting in bed, NAD Coarse breath sounds, no wheezing Regular S1-S2 no murmurs rubs Soft benign positive bowel sounds No edema Alert oriented x3, grossly intact    Data Reviewed: I have personally reviewed following labs and imaging studies  CBC: Recent Labs  Lab 11/07/20 2220 11/08/20 0542 11/10/20 0554  WBC 14.5* 13.7* 16.4*  NEUTROABS 6.2  --   --   HGB 13.3 14.0 12.7*   HCT 40.1 40.1 35.8*  MCV 91.6 89.5 88.8  PLT 302 264 195   Basic Metabolic Panel: Recent Labs  Lab 11/07/20 2220 11/08/20 0542 11/09/20 0419 11/10/20 0554  NA 137 135 134*  --   K 3.3* 3.9 4.0  --   CL 104 106 103  --   CO2 17* 18* 21*  --   GLUCOSE 189* 130* 124*  --   BUN 15 16 16   --   CREATININE 1.52* 1.22 1.15  --   CALCIUM 8.4* 8.9 8.7*  --   MG  --   --   --  2.1   GFR: Estimated Creatinine Clearance: 74.6 mL/min (by C-G formula based on SCr of 1.15 mg/dL). Liver Function Tests: No results for input(s): AST, ALT, ALKPHOS, BILITOT, PROT, ALBUMIN in the last 168 hours. No results for input(s): LIPASE, AMYLASE in the last 168 hours. No results for input(s): AMMONIA in the last 168 hours. Coagulation Profile: No results for input(s): INR, PROTIME in the last 168 hours. Cardiac Enzymes: No results for input(s): CKTOTAL, CKMB, CKMBINDEX, TROPONINI in the last 168 hours. BNP (last 3 results) No results for input(s): PROBNP in the last 8760 hours. HbA1C: No results for input(s): HGBA1C in the last 72 hours. CBG: Recent Labs  Lab 11/08/20 0010  GLUCAP 205*  Lipid Profile: Recent Labs    11/08/20 0542  CHOL 192  HDL 42  LDLCALC 130*  TRIG 99  CHOLHDL 4.6   Thyroid Function Tests: No results for input(s): TSH, T4TOTAL, FREET4, T3FREE, THYROIDAB in the last 72 hours. Anemia Panel: No results for input(s): VITAMINB12, FOLATE, FERRITIN, TIBC, IRON, RETICCTPCT in the last 72 hours. Sepsis Labs: No results for input(s): PROCALCITON, LATICACIDVEN in the last 168 hours.  Recent Results (from the past 240 hour(s))  MRSA PCR Screening     Status: Abnormal   Collection Time: 11/07/20 10:13 PM   Specimen: Nasopharyngeal  Result Value Ref Range Status   MRSA by PCR POSITIVE (A) NEGATIVE Final    Comment:        The GeneXpert MRSA Assay (FDA approved for NASAL specimens only), is one component of a comprehensive MRSA colonization surveillance program. It is  not intended to diagnose MRSA infection nor to guide or monitor treatment for MRSA infections. RESULT CALLED TO, READ BACK BY AND VERIFIED WITH: Timothy Schultz @0337  11/08/2020 TTG Performed at [facility], [address] [place], [place]   Resp Panel by RT-PCR (Flu A&B, Covid) Nasopharyngeal Swab     Status: None   Collection Time: 11/07/20 10:20 PM   Specimen: Nasopharyngeal Swab; Nasopharyngeal(NP) swabs in vial transport medium  Result Value Ref Range Status   SARS Coronavirus 2 by RT PCR NEGATIVE NEGATIVE Final    Comment: (NOTE) SARS-CoV-2 target nucleic acids are NOT DETECTED.  The SARS-CoV-2 RNA is generally detectable in upper respiratory specimens during the acute phase of infection. The lowest concentration of SARS-CoV-2 viral copies this assay can detect is 138 copies/mL. A negative result does not preclude SARS-Cov-2 infection and should not be used as the sole basis for treatment or other patient management decisions. A negative result may occur with  improper specimen collection/handling, submission of specimen other than nasopharyngeal swab, presence of viral mutation(s) within the areas targeted by this assay, and inadequate number of viral copies(<138 copies/mL). A negative result must be combined with clinical observations, patient history, and epidemiological information. The expected result is Negative.  Fact Sheet for Patients:  14/10/21  Fact Sheet for Healthcare Providers:  BloggerCourse.com  This test is no t yet approved or cleared by the SeriousBroker.it FDA and  has been authorized for detection and/or diagnosis of SARS-CoV-2 by FDA under an Emergency Use Authorization (EUA). This EUA will remain  in effect (meaning this test can be used) for the duration of the COVID-19 declaration under Section 564(b)(1) of the Act, 21 U.S.C.section 360bbb-3(b)(1), unless the  authorization is terminated  or revoked sooner.       Influenza A by PCR NEGATIVE NEGATIVE Final   Influenza B by PCR NEGATIVE NEGATIVE Final    Comment: (NOTE) The Xpert Xpress SARS-CoV-2/FLU/RSV plus assay is intended as an aid in the diagnosis of influenza from Nasopharyngeal swab specimens and should not be used as a sole basis for treatment. Nasal washings and aspirates are unacceptable for Xpert Xpress SARS-CoV-2/FLU/RSV testing.  Fact Sheet for Patients: [place]  Fact Sheet for Healthcare Providers: BloggerCourse.com  This test is not yet approved or cleared by the SeriousBroker.it FDA and has been authorized for detection and/or diagnosis of SARS-CoV-2 by FDA under an Emergency Use Authorization (EUA). This EUA will remain in effect (meaning this test can be used) for the duration of the COVID-19 declaration under Section 564(b)(1) of the Act, 21 U.S.C. section 360bbb-3(b)(1), unless the authorization is  terminated or revoked.  Performed at [facility], [address] [place], [place] [address]          Radiology Studies: [facility] Chest [place] 1 View  Result Date: 11/10/2020 CLINICAL DATA:  Fever beginning today. EXAM: PORTABLE CHEST 1 VIEW COMPARISON:  10/08/2011 FINDINGS: Heart size is normal. Mediastinal shadows are normal except for some aortic atherosclerosis. Hazy pulmonary infiltrates in the mid lungs, right more than left, consistent with bronchopneumonia. No dense consolidation, collapse or effusion. IMPRESSION: Hazy pulmonary infiltrates in the mid lungs, right more than left, consistent with bronchopneumonia. Electronically Signed   By: Paulina Fusi M.D.   On: 11/10/2020 09:24        Scheduled Meds:  aspirin EC  81 mg Oral Daily   atorvastatin  80 mg Oral Daily   buprenorphine-naloxone  1 tablet Sublingual Daily   Chlorhexidine Gluconate Cloth  6 each Topical Q0600   Chlorhexidine  Gluconate Cloth  6 each Topical Q0600   enoxaparin (LOVENOX) injection  40 mg Subcutaneous Q24H   lisinopril  2.5 mg Oral Daily   metoprolol succinate  12.5 mg Oral Daily   mupirocin ointment  1 application Nasal BID   sodium chloride flush  3 mL Intravenous Q12H   ticagrelor  90 mg Oral BID   Continuous Infusions:  sodium chloride     ampicillin-sulbactam (UNASYN) IV      Assessment & Plan:   Active Problems:   Acute ST elevation myocardial infarction (STEMI) involving left anterior descending (LAD) coronary artery (HCC)   STEMI (ST elevation myocardial infarction) (HCC)   Acute ST elevation myocardial infarction (STEMI) involving left anterior descending (LAD) coronary artery (HCC) Status post cath -s/p stents to proximal LAD -Cardiology following Aspirin and Brilinta for 1 year uninterrupted High-dose statin therapy Echo EF 30 to 35% Continue Toprol-XL Continue low-dose lisinopril Follow-up with Dr. Darrold Junker in 1-2 weeks    Ischemic cardiomyopathy Status post STEMI Echo with EF 30 to 35% Currently euvolemic on exam Continue with Toprol-XL and ACE inhibitor as noted above Discussed with patient about LifeVest and he is agreeable to it.  Cardiology will order Will need reevaluation of LV function in the near future as outpatient Follow-up with cardiology as above F/u with cardiac rehab /chf clinic  Fever-wbc increasing CXR with bronchopneumonia-concern with also aspiration since patient had arrested and received CPR and defibrillation Will check blood cultures Check UA and urine culture Start IV Unasyn    Tobacco smoker- Patient again was counseled on smoking cessation and he verbalizes an understanding.   Had declined nicotine patch on this admission      DVT prophylaxis: Lovenox Code Status: Full Family Communication: None at bedside  Status is: Inpatient  Remains inpatient appropriate because:Hemodynamically unstable   Dispo: The patient  is from: Home              Anticipated d/c is to: Home              Anticipated d/c date is: 1-2 days              Patient currently is not medically stable to d/c.Febrile, wbc up. W/u penidng          LOS: 3 days   Time spent: 45 minutes with more than 50% on COC    Lynn Ito, MD [facility] Pager [phone]  If 7PM-7AM, please contact night-coverage 11/10/2020, 2:23 PM

## 2020-11-11 DIAGNOSIS — J180 Bronchopneumonia, unspecified organism: Secondary | ICD-10-CM

## 2020-11-11 LAB — CBC
HCT: 33.7 % — ABNORMAL LOW (ref 39.0–52.0)
Hemoglobin: 11.6 g/dL — ABNORMAL LOW (ref 13.0–17.0)
MCH: 30.9 pg (ref 26.0–34.0)
MCHC: 34.4 g/dL (ref 30.0–36.0)
MCV: 89.6 fL (ref 80.0–100.0)
Platelets: 194 10*3/uL (ref 150–400)
RBC: 3.76 MIL/uL — ABNORMAL LOW (ref 4.22–5.81)
RDW: 13.2 % (ref 11.5–15.5)
WBC: 13.2 10*3/uL — ABNORMAL HIGH (ref 4.0–10.5)
nRBC: 0 % (ref 0.0–0.2)

## 2020-11-11 LAB — BASIC METABOLIC PANEL
Anion gap: 10 (ref 5–15)
BUN: 17 mg/dL (ref 6–20)
CO2: 19 mmol/L — ABNORMAL LOW (ref 22–32)
Calcium: 8.3 mg/dL — ABNORMAL LOW (ref 8.9–10.3)
Chloride: 105 mmol/L (ref 98–111)
Creatinine, Ser: 1.22 mg/dL (ref 0.61–1.24)
GFR, Estimated: >60 mL/min (ref >60)
Glucose, Bld: 119 mg/dL — ABNORMAL HIGH (ref 70–99)
Potassium: 3.6 mmol/L (ref 3.5–5.1)
Sodium: 134 mmol/L — ABNORMAL LOW (ref 135–145)

## 2020-11-11 LAB — URINE CULTURE: Culture: NO GROWTH

## 2020-11-11 NOTE — Plan of Care (Signed)

## 2020-11-11 NOTE — Progress Notes (Signed)
PT Cancellation Note  Patient Details Name: Timothy Schultz MRN: [id number] DOB: 1961/04/25   Cancelled Treatment:    Reason Eval/Treat Not Completed: PT screened, no needs identified, will sign off. Patient up walking 2 laps around nursing station with OT. No PT needs at this time.     [name] 11/11/2020, 11:13 AM

## 2020-11-11 NOTE — Progress Notes (Signed)
PROGRESS NOTE    Timothy Schultz  KVQ:[id number] DOB: 03-26-1961 DOA: 11/07/2020 PCP: No primary care provider on file.    Brief Narrative:  [name] a 59 y.o.malewithno significant past medical history who presented earlier by EMS as code STEMI. In route he had a seizure-like activity and was found to be in V. fib and was shocked. 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 He received 3 shocks during cath for V. tach/V. fib and was given an amiodarone bolus and placed on an amiodarone drip. Transferred out of stepdown unit to PCU.  CC: STEMI 12/13 -febrile last night with leukocytosis. Tmax 100.5 CXR with bronchopneumonia Telemetry sinus tachycardia, sinus rhythm  12/14-awaiting Life vest. Feels weak agreeable to PT. telemetry at times with sinus tachycardia  Consultants:   cardiology  Procedures: cath  Antimicrobials:       Subjective: Son at bedside, videoing my conversation and exam with the patient.  Pt reports feeling weak. Denies sob, cp, dizziness.    Objective: Vitals:   11/11/20 0333 11/11/20 0812 11/11/20 0951 11/11/20 1119  BP: 97/69 94/60 98/69  90/65  Pulse: 81 77  77  Resp:  18  18  Temp: 98.7 F (37.1 C) 98.7 F (37.1 C)  97.8 F (36.6 C)  TempSrc: Oral Oral  Oral  SpO2: 93% 96%  98%  Weight: 75.8 kg     Height:        Intake/Output Summary (Last 24 hours) at 11/11/2020 1448 Last data filed at 11/11/2020 1300 Gross per 24 hour  Intake 700 ml  Output 700 ml  Net 0 ml   Filed Weights   11/09/20 0511 11/10/20 0500 11/11/20 0333  Weight: (P) 77.4 kg 77 kg 75.8 kg    Examination: Calm, comfortable Coarse breath sounds, no wheezing Regular S1-S2 no murmurs Soft benign positive bowel sounds No edema Alert oriented x3 grossly intact Mood and affect appropriate in current setting    Data Reviewed: I have personally reviewed  following labs and imaging studies  CBC: Recent Labs  Lab 11/07/20 2220 11/08/20 0542 11/10/20 0554 11/11/20 0603  WBC 14.5* 13.7* 16.4* 13.2*  NEUTROABS 6.2  --   --   --   HGB 13.3 14.0 12.7* 11.6*  HCT 40.1 40.1 35.8* 33.7*  MCV 91.6 89.5 88.8 89.6  PLT 302 264 195 194   Basic Metabolic Panel: Recent Labs  Lab 11/07/20 2220 11/08/20 0542 11/09/20 0419 11/10/20 0554 11/11/20 0603  NA 137 135 134*  --  134*  K 3.3* 3.9 4.0  --  3.6  CL 104 106 103  --  105  CO2 17* 18* 21*  --  19*  GLUCOSE 189* 130* 124*  --  119*  BUN 15 16 16   --  17  CREATININE 1.52* 1.22 1.15  --  1.22  CALCIUM 8.4* 8.9 8.7*  --  8.3*  MG  --   --   --  2.1  --    GFR: Estimated Creatinine Clearance: 70.3 mL/min (by C-G formula based on SCr of 1.22 mg/dL). Liver Function Tests: No results for input(s): AST, ALT, ALKPHOS, BILITOT, PROT, ALBUMIN in the last 168 hours. No results for input(s): LIPASE, AMYLASE in the last 168 hours. No results for input(s): AMMONIA in the last 168 hours. Coagulation Profile: No results for input(s): INR, PROTIME in the last 168 hours. Cardiac Enzymes: No results for  input(s): CKTOTAL, CKMB, CKMBINDEX, TROPONINI in the last 168 hours. BNP (last 3 results) No results for input(s): PROBNP in the last 8760 hours. HbA1C: No results for input(s): HGBA1C in the last 72 hours. CBG: Recent Labs  Lab 11/08/20 0010  GLUCAP 205*   Lipid Profile: No results for input(s): CHOL, HDL, LDLCALC, TRIG, CHOLHDL, LDLDIRECT in the last 72 hours. Thyroid Function Tests: No results for input(s): TSH, T4TOTAL, FREET4, T3FREE, THYROIDAB in the last 72 hours. Anemia Panel: No results for input(s): VITAMINB12, FOLATE, FERRITIN, TIBC, IRON, RETICCTPCT in the last 72 hours. Sepsis Labs: No results for input(s): PROCALCITON, LATICACIDVEN in the last 168 hours.  Recent Results (from the past 240 hour(s))  MRSA PCR Screening     Status: Abnormal   Collection Time: 11/07/20 10:13 PM    Specimen: Nasopharyngeal  Result Value Ref Range Status   MRSA by PCR POSITIVE (A) NEGATIVE Final    Comment:        The GeneXpert MRSA Assay (FDA approved for NASAL specimens only), is one component of a comprehensive MRSA colonization surveillance program. It is not intended to diagnose MRSA infection nor to guide or monitor treatment for MRSA infections. RESULT CALLED TO, READ BACK BY AND VERIFIED WITH: [facility] CAMPBELL @0337  11/08/2020 TTG Performed at [facility], [address] [place], [place]   Resp Panel by RT-PCR (Flu A&B, Covid) Nasopharyngeal Swab     Status: None   Collection Time: 11/07/20 10:20 PM   Specimen: Nasopharyngeal Swab; Nasopharyngeal(NP) swabs in vial transport medium  Result Value Ref Range Status   SARS Coronavirus 2 by RT PCR NEGATIVE NEGATIVE Final    Comment: (NOTE) SARS-CoV-2 target nucleic acids are NOT DETECTED.  The SARS-CoV-2 RNA is generally detectable in upper respiratory specimens during the acute phase of infection. The lowest concentration of SARS-CoV-2 viral copies this assay can detect is 138 copies/mL. A negative result does not preclude SARS-Cov-2 infection and should not be used as the sole basis for treatment or other patient management decisions. A negative result may occur with  improper specimen collection/handling, submission of specimen other than nasopharyngeal swab, presence of viral mutation(s) within the areas targeted by this assay, and inadequate number of viral copies(<138 copies/mL). A negative result must be combined with clinical observations, patient history, and epidemiological information. The expected result is Negative.  Fact Sheet for Patients:  14/10/21  Fact Sheet for Healthcare Providers:  BloggerCourse.com  This test is no t yet approved or cleared by the SeriousBroker.it FDA and  has been authorized for detection and/or  diagnosis of SARS-CoV-2 by FDA under an Emergency Use Authorization (EUA). This EUA will remain  in effect (meaning this test can be used) for the duration of the COVID-19 declaration under Section 564(b)(1) of the Act, 21 U.S.C.section 360bbb-3(b)(1), unless the authorization is terminated  or revoked sooner.       Influenza A by PCR NEGATIVE NEGATIVE Final   Influenza B by PCR NEGATIVE NEGATIVE Final    Comment: (NOTE) The Xpert Xpress SARS-CoV-2/FLU/RSV plus assay is intended as an aid in the diagnosis of influenza from Nasopharyngeal swab specimens and should not be used as a sole basis for treatment. Nasal washings and aspirates are unacceptable for Xpert Xpress SARS-CoV-2/FLU/RSV testing.  Fact Sheet for Patients: [place]  Fact Sheet for Healthcare Providers: BloggerCourse.com  This test is not yet approved or cleared by the SeriousBroker.it FDA and has been authorized for detection and/or diagnosis of SARS-CoV-2 by FDA under  an Emergency Use Authorization (EUA). This EUA will remain in effect (meaning this test can be used) for the duration of the COVID-19 declaration under Section 564(b)(1) of the Act, 21 U.S.C. section 360bbb-3(b)(1), unless the authorization is terminated or revoked.  Performed at [facility], [address], [place], [place] [address]   CULTURE, BLOOD (ROUTINE X 2) w Reflex to ID Panel     Status: None (Preliminary result)   Collection Time: 11/10/20  9:15 AM   Specimen: BLOOD  Result Value Ref Range Status   Specimen Description BLOOD LEFT HAND  Final   Special Requests   Final    BOTTLES DRAWN AEROBIC AND ANAEROBIC Blood Culture results may not be optimal due to an inadequate volume of blood received in culture bottles   Culture   Final    NO GROWTH < 24 HOURS Performed at [facility], [address] [place], [place] [address]    Report Status PENDING  Incomplete   CULTURE, BLOOD (ROUTINE X 2) w Reflex to ID Panel     Status: None (Preliminary result)   Collection Time: 11/10/20  9:16 AM   Specimen: BLOOD  Result Value Ref Range Status   Specimen Description BLOOD RIGHT HAND  Final   Special Requests   Final    BOTTLES DRAWN AEROBIC AND ANAEROBIC Blood Culture results may not be optimal due to an inadequate volume of blood received in culture bottles   Culture   Final    NO GROWTH < 24 HOURS Performed at [facility], [address] [place], [place] [address]    Report Status PENDING  Incomplete  Urine Culture     Status: None   Collection Time: 11/10/20 10:42 AM   Specimen: Urine, Random  Result Value Ref Range Status   Specimen Description   Final    URINE, RANDOM Performed at [facility], [address], [place], [place] [address]    Special Requests   Final    NONE Performed at [facility], [address] [place], [place] [address]    Culture   Final    NO GROWTH Performed at [facility], [address], [place], [place] [address]    Report Status 11/11/2020 FINAL  Final         Radiology Studies: DG Chest Port 1 View  Result Date: 11/10/2020 CLINICAL DATA:  Fever beginning today. EXAM: PORTABLE CHEST 1 VIEW COMPARISON:  10/08/2011 FINDINGS: Heart size is normal. Mediastinal shadows are normal except for some aortic atherosclerosis. Hazy pulmonary infiltrates in the mid lungs, right more than left, consistent with bronchopneumonia. No dense consolidation, collapse or effusion. IMPRESSION: Hazy pulmonary infiltrates in the mid lungs, right more than left, consistent with bronchopneumonia. Electronically Signed   By: Paulina Fusi M.D.   On: 11/10/2020 09:24        Scheduled Meds: . aspirin EC  81 mg Oral Daily  . atorvastatin  80 mg Oral Daily  . buprenorphine-naloxone  1 tablet Sublingual Daily  . Chlorhexidine Gluconate Cloth  6 each Topical Q0600  . Chlorhexidine Gluconate Cloth   6 each Topical Q0600  . enoxaparin (LOVENOX) injection  40 mg Subcutaneous Q24H  . lisinopril  2.5 mg Oral Daily  . metoprolol succinate  12.5 mg Oral Daily  . mupirocin ointment  1 application Nasal BID  . sodium chloride flush  3 mL Intravenous Q12H  . ticagrelor  90 mg Oral BID   Continuous Infusions: .  sodium chloride    . ampicillin-sulbactam (UNASYN) IV 3 g (11/11/20 0946)    Assessment & Plan:   Active Problems:   Acute ST elevation myocardial infarction (STEMI) involving left anterior descending (LAD) coronary artery (HCC)   STEMI (ST elevation myocardial infarction) (HCC)   Acute ST elevation myocardial infarction (STEMI) involving left anterior descending (LAD) coronary artery (HCC) Status post cath -s/p stents to proximal LAD -Cardiology following Aspirin and Brilinta for 1 year uninterrupted   high-dose statins  EF 30 to 35% Continue Toprol-XL Continue lisinopril Awaiting LifeVest to be delivered prior to discharge Follow-up with Dr. Darrold JunkerParaschos in 1 to 2 weeks     Ischemic cardiomyopathy Status post STEMI Echo with EF 30 to 35% Currently euvolemic Continue with Toprol-XL and ACE inhibitor's as noted above Awaiting LifeVest to be delivered prior to discharge Will need reevaluation of LV in the near future as an outpatient Follow-up cardiac cath/CHF clinic Follow-up with cardiology as above    Fever with Leukocytosis-  CXR with bronchopneumonia-concern with also aspiration since patient had arrested and received CPR and defibrillation Still tachycardic at times Leukocytosis is slowly improving Started on iv Unasyn, continue Bcx and ucx pending, f/u Monitor a.m. CBC    Tobacco smoker- Patient again was counseled on smoking cessation and he verbalizes an understanding.   Had declined nicotine patch on this admission      DVT prophylaxis: Lovenox Code Status: Full Family Communication: family at bedside  Status is: Inpatient  Remains  inpatient appropriate because: Of the severity of illness requiring inpatient hospitalization   Dispo: The patient is from: Home              Anticipated d/c is to: Home              Anticipated d/c date is: 1-2 days              Patient currently is not medically stable to d/c. Needs LifeVest prior to discharge. Follow-up cultures and 1 more day of IV Unasyn and follow-up labs in a.m. He is tachycardic which needs to be monitored too.         LOS: 4 days   Time spent: 35 minutes with more than 50% on COC    Lynn ItoSahar Rosalyn Archambault, MD [facility] Pager [phone]  If 7PM-7AM, please contact night-coverage 11/11/2020, 2:48 PM

## 2020-11-11 NOTE — Evaluation (Signed)
Occupational Therapy Evaluation Patient Details Name: Timothy Schultz MRN: [id number] DOB: [birth year] Today's Date: 11/11/2020    History of Present Illness 59 y.o. male with no significant past medical history who presented earlier by EMS as code STEMI.  In route he had a seizure-like activity and was found to be in V. fib and was shocked.  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  He received 3 shocks during cath for V. tach/V. fib and was given an amiodarone bolus and placed on an amiodarone drip.  Transferred out of stepdown unit to PCU.   Clinical Impression   Pt seen for skilled OT evaluation. Pt performing all aspects of self care and functional mobility independently without use of AD. Pt standing without LOB and ambulating 300' without issue. Pt lives with significant other at home and works full time. Pt's vitals all remained WNLs with therapeutic exercise/activities. Pt with no skilled needs at this time. No follow up recommended. OT will SIGN OFF. Thank you for this referral.     Follow Up Recommendations  No OT follow up    Equipment Recommendations  None recommended by OT       Precautions / Restrictions Precautions Precautions: None      Mobility Bed Mobility Overal bed mobility: Independent                  Transfers Overall transfer level: Independent Equipment used: None                  Balance Overall balance assessment: Independent                                         ADL either performed or assessed with clinical judgement   ADL Overall ADL's : Independent                                             Vision Baseline Vision/History: No visual deficits Patient Visual Report: No change from baseline              Pertinent Vitals/Pain Pain Assessment: No/denies pain     Hand Dominance Right    Extremity/Trunk Assessment Upper Extremity Assessment Upper Extremity Assessment: Overall WFL for tasks assessed   Lower Extremity Assessment Lower Extremity Assessment: Overall WFL for tasks assessed   Cervical / Trunk Assessment Cervical / Trunk Assessment: Normal   Communication Communication Communication: No difficulties   Cognition Arousal/Alertness: Awake/alert Behavior During Therapy: WFL for tasks assessed/performed Overall Cognitive Status: Within Functional Limits for tasks assessed                                                Home Living Family/patient expects to be discharged to:: Private residence Living Arrangements: Spouse/significant other Available Help at Discharge: Family;Available 24 hours/day Type of Home: House Home Access: Stairs to enter [employer] of Steps: 3 Entrance Stairs-Rails: Right;Left Home Layout: One level;Other (Comment) (basement but used for storage)     Bathroom Shower/Tub: Producer, television/film/video: Standard  Home Equipment: None          Prior Functioning/Environment Level of Independence: Independent        Comments: works full time                 Leisure centre manager goals can be found in the care plan section) Acute Rehab OT Goals Patient Stated Goal: to go home OT Goal Formulation: With patient   AM-PAC OT "6 Clicks" Daily Activity     Outcome Measure Help from another person eating meals?: None Help from another person taking care of personal grooming?: None Help from another person toileting, which includes using toliet, bedpan, or urinal?: None Help from another person bathing (including washing, rinsing, drying)?: None Help from another person to put on and taking off regular upper body clothing?: None Help from another person to put on and taking off regular lower body clothing?: None 6 Click Score: 24   End of Session Nurse Communication: Mobility  status  Activity Tolerance: Patient tolerated treatment well Patient left: in bed;with call bell/phone within reach;with family/visitor present                   Time: 9518-8416 OT Time Calculation (min): 15 min Charges:  OT General Charges $OT Visit: 1 Visit OT Evaluation $OT Eval Low Complexity: 1 Low  Jackquline Denmark, MS, OTR/L , CBIS ascom [phone]  11/11/20, 11:20 AM

## 2020-11-12 DIAGNOSIS — J189 Pneumonia, unspecified organism: Secondary | ICD-10-CM

## 2020-11-12 LAB — CBC
HCT: 34.3 % — ABNORMAL LOW (ref 39.0–52.0)
Hemoglobin: 11.7 g/dL — ABNORMAL LOW (ref 13.0–17.0)
MCH: 30.8 pg (ref 26.0–34.0)
MCHC: 34.1 g/dL (ref 30.0–36.0)
MCV: 90.3 fL (ref 80.0–100.0)
Platelets: 239 10*3/uL (ref 150–400)
RBC: 3.8 MIL/uL — ABNORMAL LOW (ref 4.22–5.81)
RDW: 13.1 % (ref 11.5–15.5)
WBC: 11.1 10*3/uL — ABNORMAL HIGH (ref 4.0–10.5)
nRBC: 0 % (ref 0.0–0.2)

## 2020-11-12 NOTE — Discharge Summary (Signed)
Physician Discharge Summary  Timothy Schultz RXV:[id number] DOB: August 22, 1961 DOA: 11/07/2020  PCP: No primary care provider on file.  Admit date: 11/07/2020 Discharge date: 11/13/20  Admitted From: home  Disposition:  Home   Recommendations for Outpatient Follow-up:  1. Follow up with PCP in 1-2 weeks 2. F/u w/ cardio, Dr. Darrold Junker in 1-2 weeks    Home Health:  Equipment/Devices:  Discharge Condition: stable  CODE STATUS:  Diet recommendation: Heart Healthy  Brief/Interim Summary: HPI was taken from Dr. Para March: Timothy Schultz is a 59 y.o. male with no significant past medical history who presented earlier by EMS as code STEMI.  In route he had a seizure-like activity and was found to be in V. fib and was shocked.  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  He received 3 shocks during cath for V. tach/V. fib and was given an amiodarone bolus and placed on an amiodarone drip.  Postprocedure his chest pain was down to 1 out of 10 from 9 out of 10 preprocedure.  Cardiologist, Dr. Darrold Junker requested hospitalist admit post cath   Hospital Course from Dr. Mayford Knife: Pt was found to have STEMI and stents were placed in proximal LAD during cardiac cath. Pt will continue on aspirin, brilinta, metoprolol, lisinopril as per cardio. Of note, pt was found to have an EF 30-35% and a life vest was recommended as per cardio. Life vest was given to pt and received education prior to d/c. Pt will f/u w/ cardio, Dr. Darrold Junker, in 1-2 weeks.   Discharge Diagnoses:  Active Problems:   Acute ST elevation myocardial infarction (STEMI) involving left anterior descending (LAD) coronary artery (HCC)   STEMI (ST elevation myocardial infarction) (HCC)   Acute STEMI: s/p cardiac cath w/ stents placed in proximal LAD. Continue on aspirin, brilinta, metoprolol, lisinopril. EF 30-35%. Life vest was delivered prior to  d/c   Ischemic cardiomyopathy: s/p STEMI. EF 30-35%. Continue on metoprolol, lisinopril. Cardio following and recs apprec   Pneumonia: possibly aspiration. Continue on IV unasyn while inpatient will be switched to po augmentin at d/c.   Leukocytosis: likely secondary to infection. Continue on IV abxs    Tobacco smoker: smoking cessation counseling   Discharge Instructions  Discharge Instructions    AMB Referral to Cardiac Rehabilitation - Phase II   Complete by: As directed    Diagnosis:  Coronary Stents STEMI     After initial evaluation and assessments completed: Virtual Based Care may be provided alone or in conjunction with Phase 2 Cardiac Rehab based on patient barriers.: Yes   Diet - low sodium heart healthy   Complete by: As directed    Discharge instructions   Complete by: As directed    F/u PCP in 1-2 weeks. F/u w/ cardio, Dr. Darrold Junker, in 1-2 weeks   Increase activity slowly   Complete by: As directed    No wound care   Complete by: As directed      Allergies as of 11/13/2020   No Known Allergies     Medication List    TAKE these medications   amoxicillin-clavulanate 875-125 MG tablet Commonly known as: Augmentin Take 1 tablet by mouth 2 (two) times daily for 2 days.   aspirin 81 MG EC tablet Take 1 tablet (81 mg total) by mouth daily. Swallow whole.   atorvastatin 80 MG tablet Commonly known as: LIPITOR Take 1 tablet (80  mg total) by mouth daily.   buprenorphine 8 MG Subl SL tablet Commonly known as: SUBUTEX Place 8 mg under the tongue daily.   lisinopril 2.5 MG tablet Commonly known as: ZESTRIL Take 1 tablet (2.5 mg total) by mouth daily.   metoprolol succinate 25 MG 24 hr tablet Commonly known as: TOPROL-XL Take 0.5 tablets (12.5 mg total) by mouth daily.   ticagrelor 90 MG Tabs tablet Commonly known as: BRILINTA Take 1 tablet (90 mg total) by mouth 2 (two) times daily.            Durable Medical Equipment  (From admission,  onward)         Start     Ordered   11/13/20 1144  For home use only DME 3 n 1  Once        11/13/20 1144          Follow-up Information    [facility] Follow up on 11/18/2020.   Specialty: Cardiology Why: at 9:00am. Enter through the Medical Mall entrance Contact information: [address] [place] [address] [phone]       Timothy Millard, MD In 1 week.   Specialty: Cardiology Why: @ 11am Please bring insurance card and photo I.D. Contact information: [address] [facility] [place] [address] [phone]              No Known Allergies  Consultations: Cardio, Dr. Darrold Junker   Procedures/Studies: CARDIAC CATHETERIZATION  Result Date: 11/08/2020  Prox LAD to Mid LAD lesion is 100% stenosed.  2nd Diag lesion is 70% stenosed.  1st Mrg lesion is 75% stenosed.  Prox Cx to Mid Cx lesion is 60% stenosed.  A drug-eluting stent was successfully placed using a STENT SYNERGY DES 3X28.  Post intervention, there is a 0% residual stenosis.  Mid LAD-1 lesion is 70% stenosed.  Mid LAD-2 lesion is 40% stenosed.  A drug-eluting stent was successfully placed using a STENT SYNERGY DES 2.50X8.  Post intervention, there is a 0% residual stenosis.  Mid LM lesion is 30% stenosed.  Prox RCA lesion is 70% stenosed.  1.  Anterior ST elevation myocardial infarction 2.  Occluded proximal LAD 3.  Preserved left ventricular function 4.  Successful primary PCI with overlapping 3,0 x 28 mm and 2.5 x 8 mm Synergy stents proximal/mid LAD Recommendations 1.  Dual antiplatelet therapy uninterrupted for 1 year 2.  High intensity atorvastatin 3.  Start metoprolol tartrate 25 mg twice daily 4.  2D echocardiogram 5.  Strongly advised patient to stop smoking   DG Chest Port 1 View  Result Date: 11/10/2020 CLINICAL DATA:  Fever beginning today. EXAM: PORTABLE CHEST 1 VIEW COMPARISON:   10/08/2011 FINDINGS: Heart size is normal. Mediastinal shadows are normal except for some aortic atherosclerosis. Hazy pulmonary infiltrates in the mid lungs, right more than left, consistent with bronchopneumonia. No dense consolidation, collapse or effusion. IMPRESSION: Hazy pulmonary infiltrates in the mid lungs, right more than left, consistent with bronchopneumonia. Electronically Signed   By: Paulina Fusi M.D.   On: 11/10/2020 09:24   ECHOCARDIOGRAM COMPLETE  Result Date: 11/08/2020    ECHOCARDIOGRAM REPORT   Patient Name:   ZIMRI BRENNEN Date of Exam: 11/08/2020 Medical Rec #:  [id number]        Height:       71.0 in Accession #:    [id number]       Weight:  172.2 lb Date of Birth:  May 21, 1961        BSA:          1.979 m Patient Age:    59 years         BP:           119/105 mmHg Patient Gender: M                HR:           90 bpm. Exam Location:  [facility] Procedure: 2D Echo Indications:     Chest Pain R07.9  History:         Patient has no prior history of Echocardiogram examinations.  Sonographer:     Wonda Cerise RDCS Referring Phys:  [id number] [facility] AMERY Diagnosing Phys: Timothy Millard MD  Sonographer Comments: Technically difficult study due to poor echo windows and suboptimal parasternal window. Image acquisition challenging due to respiratory motion. IMPRESSIONS  1. Left ventricular ejection fraction, by estimation, is 30 to 35%. The left ventricle has moderately decreased function. The left ventricle demonstrates regional wall motion abnormalities (see scoring diagram/findings for description). Left ventricular  diastolic parameters were normal.  2. Right ventricular systolic function is normal. The right ventricular size is normal.  3. The mitral valve is normal in structure. Mild mitral valve regurgitation. No evidence of mitral stenosis.  4. The aortic valve is normal in structure. Aortic valve regurgitation is not visualized. No aortic stenosis is present.  5. The inferior vena  cava is normal in size with greater than 50% respiratory variability, suggesting right atrial pressure of 3 mmHg. FINDINGS  Left Ventricle: Left ventricular ejection fraction, by estimation, is 30 to 35%. The left ventricle has moderately decreased function. The left ventricle demonstrates regional wall motion abnormalities. The left ventricular internal cavity size was normal in size. There is no left ventricular hypertrophy. Left ventricular diastolic parameters were normal.  LV Wall Scoring: The apical lateral segment, apical anterior segment, and apex are akinetic. The apical septal segment is hypokinetic. Right Ventricle: The right ventricular size is normal. No increase in right ventricular wall thickness. Right ventricular systolic function is normal. Left Atrium: Left atrial size was normal in size. Right Atrium: Right atrial size was normal in size. Pericardium: There is no evidence of pericardial effusion. Mitral Valve: The mitral valve is normal in structure. Mild mitral valve regurgitation. No evidence of mitral valve stenosis. Tricuspid Valve: The tricuspid valve is normal in structure. Tricuspid valve regurgitation is mild . No evidence of tricuspid stenosis. Aortic Valve: The aortic valve is normal in structure. Aortic valve regurgitation is not visualized. No aortic stenosis is present. Pulmonic Valve: The pulmonic valve was normal in structure. Pulmonic valve regurgitation is not visualized. No evidence of pulmonic stenosis. Aorta: The aortic root is normal in size and structure. Venous: The inferior vena cava is normal in size with greater than 50% respiratory variability, suggesting right atrial pressure of 3 mmHg. IAS/Shunts: No atrial level shunt detected by color flow Doppler.  LEFT VENTRICLE PLAX 2D LVIDd:         5.21 cm      Diastology LVIDs:         4.47 cm      LV e' medial:    7.94 cm/s LV PW:         0.99 cm      LV E/e' medial:  9.4 LV IVS:        1.12 cm  LV e' lateral:   8.16 cm/s  LVOT diam:     1.90 cm      LV E/e' lateral: 9.1 LV SV:         25 LV SV Index:   13 LVOT Area:     2.84 cm  LV Volumes (MOD) LV vol d, MOD A4C: 105.0 ml LV vol s, MOD A4C: 67.6 ml LV SV MOD A4C:     105.0 ml RIGHT VENTRICLE RV Basal diam:  2.23 cm RV S prime:     10.30 cm/s TAPSE (M-mode): 1.8 cm LEFT ATRIUM             Index      RIGHT ATRIUM          Index LA diam:        2.90 cm 1.47 cm/m RA Area:     7.62 cm LA Vol (A2C):   14.1 ml 7.13 ml/m RA Volume:   14.10 ml 7.13 ml/m LA Vol (A4C):   17.0 ml 8.59 ml/m LA Biplane Vol: 16.0 ml 8.09 ml/m  AORTIC VALVE LVOT Vmax:   56.10 cm/s LVOT Vmean:  36.200 cm/s LVOT VTI:    0.089 m  AORTA Ao Root diam: 2.50 cm MITRAL VALVE MV Area (PHT): 7.09 cm    SHUNTS MV Decel Time: 107 msec    Systemic VTI:  0.09 m MV E velocity: 74.60 cm/s  Systemic Diam: 1.90 cm MV A velocity: 74.60 cm/s MV E/A ratio:  1.00 Timothy Millard MD Electronically signed by Timothy Millard MD Signature Date/Time: 11/08/2020/5:05:26 PM    Final       Subjective: pt c/o fatigue    Discharge Exam: Vitals:   11/13/20 0630 11/13/20 0743  BP: 109/72 109/74  Pulse: 73 76  Resp:  18  Temp: 98.4 F (36.9 C) 97.7 F (36.5 C)  SpO2: 99% 94%   Vitals:   11/13/20 0158 11/13/20 0319 11/13/20 0630 11/13/20 0743  BP:  109/75 109/72 109/74  Pulse:  85 73 76  Resp:  15  18  Temp:  98.1 F (36.7 C) 98.4 F (36.9 C) 97.7 F (36.5 C)  TempSrc:  Oral Oral Oral  SpO2:  97% 99% 94%  Weight: 74.8 kg     Height:        General: Pt is alert, awake, not in acute distress Cardiovascular: S1/S2 +, no rubs, no gallops Respiratory: CTA bilaterally, no wheezing, no rhonchi Abdominal: Soft, NT, ND, bowel sounds + Extremities: no edema, no cyanosis    The results of significant diagnostics from this hospitalization (including imaging, microbiology, ancillary and laboratory) are listed below for reference.     Microbiology: Recent Results (from the past 240 hour(s))  MRSA PCR  Screening     Status: Abnormal   Collection Time: 11/07/20 10:13 PM   Specimen: Nasopharyngeal  Result Value Ref Range Status   MRSA by PCR POSITIVE (A) NEGATIVE Final    Comment:        The GeneXpert MRSA Assay (FDA approved for NASAL specimens only), is one component of a comprehensive MRSA colonization surveillance program. It is not intended to diagnose MRSA infection nor to guide or monitor treatment for MRSA infections. RESULT CALLED TO, READ BACK BY AND VERIFIED WITH: [facility] CAMPBELL  11/08/2020 TTG Performed at [facility], [address] [place], [place] [address]   Resp Panel by RT-PCR (Flu A&B, Covid) Nasopharyngeal Swab     Status: None   Collection Time: 11/07/20 10:20 PM  Specimen: Nasopharyngeal Swab; Nasopharyngeal(NP) swabs in vial transport medium  Result Value Ref Range Status   SARS Coronavirus 2 by RT PCR NEGATIVE NEGATIVE Final    Comment: (NOTE) SARS-CoV-2 target nucleic acids are NOT DETECTED.  The SARS-CoV-2 RNA is generally detectable in upper respiratory specimens during the acute phase of infection. The lowest concentration of SARS-CoV-2 viral copies this assay can detect is 138 copies/mL. A negative result does not preclude SARS-Cov-2 infection and should not be used as the sole basis for treatment or other patient management decisions. A negative result may occur with  improper specimen collection/handling, submission of specimen other than nasopharyngeal swab, presence of viral mutation(s) within the areas targeted by this assay, and inadequate number of viral copies(<138 copies/mL). A negative result must be combined with clinical observations, patient history, and epidemiological information. The expected result is Negative.  Fact Sheet for Patients:  BloggerCourse.com  Fact Sheet for Healthcare Providers:  SeriousBroker.it  This test is no t yet approved or cleared by the  [place] FDA and  has been authorized for detection and/or diagnosis of SARS-CoV-2 by FDA under an Emergency Use Authorization (EUA). This EUA will remain  in effect (meaning this test can be used) for the duration of the COVID-19 declaration under Section 564(b)(1) of the Act, 21 U.S.C.section 360bbb-3(b)(1), unless the authorization is terminated  or revoked sooner.       Influenza A by PCR NEGATIVE NEGATIVE Final   Influenza B by PCR NEGATIVE NEGATIVE Final    Comment: (NOTE) The Xpert Xpress SARS-CoV-2/FLU/RSV plus assay is intended as an aid in the diagnosis of influenza from Nasopharyngeal swab specimens and should not be used as a sole basis for treatment. Nasal washings and aspirates are unacceptable for Xpert Xpress SARS-CoV-2/FLU/RSV testing.  Fact Sheet for Patients: BloggerCourse.com  Fact Sheet for Healthcare Providers: SeriousBroker.it  This test is not yet approved or cleared by the [place] FDA and has been authorized for detection and/or diagnosis of SARS-CoV-2 by FDA under an Emergency Use Authorization (EUA). This EUA will remain in effect (meaning this test can be used) for the duration of the COVID-19 declaration under Section 564(b)(1) of the Act, 21 U.S.C. section 360bbb-3(b)(1), unless the authorization is terminated or revoked.  Performed at [facility], [address] [place], [place] [address]   CULTURE, BLOOD (ROUTINE X 2) w Reflex to ID Panel     Status: None (Preliminary result)   Collection Time: 11/10/20  9:15 AM   Specimen: BLOOD  Result Value Ref Range Status   Specimen Description BLOOD LEFT HAND  Final   Special Requests   Final    BOTTLES DRAWN AEROBIC AND ANAEROBIC Blood Culture results may not be optimal due to an inadequate volume of blood received in culture bottles   Culture   Final    NO GROWTH 3 DAYS Performed at [facility], [address]  [place], [place] [address]    Report Status PENDING  Incomplete  CULTURE, BLOOD (ROUTINE X 2) w Reflex to ID Panel     Status: None (Preliminary result)   Collection Time: 11/10/20  9:16 AM   Specimen: BLOOD  Result Value Ref Range Status   Specimen Description BLOOD RIGHT HAND  Final   Special Requests   Final    BOTTLES DRAWN AEROBIC AND ANAEROBIC Blood Culture results may not be optimal due to an inadequate volume of blood received in culture bottles   Culture   Final  NO GROWTH 3 DAYS Performed at [facility], [address] [place], [place] [address]    Report Status PENDING  Incomplete  Urine Culture     Status: None   Collection Time: 11/10/20 10:42 AM   Specimen: Urine, Random  Result Value Ref Range Status   Specimen Description   Final    URINE, RANDOM Performed at [facility], [address] [place], [place] [address]    Special Requests   Final    NONE Performed at [facility], [address], [place], [place] [address]    Culture   Final    NO GROWTH Performed at [facility], [address] [place], [place] [address]    Report Status 11/11/2020 FINAL  Final     Labs: BNP (last 3 results) No results for input(s): BNP in the last 8760 hours. Basic Metabolic Panel: Recent Labs  Lab 11/07/20 2220 11/08/20 0542 11/09/20 0419 11/10/20 0554 11/11/20 0603 11/13/20 0650  NA 137 135 134*  --  134* 136  K 3.3* 3.9 4.0  --  3.6 3.4*  CL 104 106 103  --  105 104  CO2 17* 18* 21*  --  19* 20*  GLUCOSE 189* 130* 124*  --  119* 94  BUN 15 16 16   --  17 13  CREATININE 1.52* 1.22 1.15  --  1.22 1.04  CALCIUM 8.4* 8.9 8.7*  --  8.3* 8.4*  MG  --   --   --  2.1  --   --    Liver Function Tests: No results for input(s): AST, ALT, ALKPHOS, BILITOT, PROT, ALBUMIN in the last 168 hours. No results for input(s): LIPASE, AMYLASE in the last 168 hours. No results for input(s): AMMONIA in the last 168 hours. CBC: Recent Labs  Lab  11/07/20 2220 11/08/20 0542 11/10/20 0554 11/11/20 0603 11/12/20 0615 11/13/20 0650  WBC 14.5* 13.7* 16.4* 13.2* 11.1* 9.4  NEUTROABS 6.2  --   --   --   --   --   HGB 13.3 14.0 12.7* 11.6* 11.7* 11.9*  HCT 40.1 40.1 35.8* 33.7* 34.3* 34.2*  MCV 91.6 89.5 88.8 89.6 90.3 89.3  PLT 302 264 195 194 239 303   Cardiac Enzymes: No results for input(s): CKTOTAL, CKMB, CKMBINDEX, TROPONINI in the last 168 hours. BNP: Invalid input(s): POCBNP CBG: Recent Labs  Lab 11/08/20 0010  GLUCAP 205*   D-Dimer No results for input(s): DDIMER in the last 72 hours. Hgb A1c No results for input(s): HGBA1C in the last 72 hours. Lipid Profile No results for input(s): CHOL, HDL, LDLCALC, TRIG, CHOLHDL, LDLDIRECT in the last 72 hours. Thyroid function studies No results for input(s): TSH, T4TOTAL, T3FREE, THYROIDAB in the last 72 hours.  Invalid input(s): FREET3 Anemia work up No results for input(s): VITAMINB12, FOLATE, FERRITIN, TIBC, IRON, RETICCTPCT in the last 72 hours. Urinalysis    Component Value Date/Time   COLORURINE YELLOW (A) 11/10/2020 1042   APPEARANCEUR CLEAR (A) 11/10/2020 1042   LABSPEC 1.024 11/10/2020 1042   PHURINE 7.0 11/10/2020 1042   GLUCOSEU NEGATIVE 11/10/2020 1042   HGBUR MODERATE (A) 11/10/2020 1042   BILIRUBINUR NEGATIVE 11/10/2020 1042   KETONESUR NEGATIVE 11/10/2020 1042   PROTEINUR 30 (A) 11/10/2020 1042   NITRITE NEGATIVE 11/10/2020 1042   LEUKOCYTESUR NEGATIVE 11/10/2020 1042   Sepsis Labs Invalid input(s): PROCALCITONIN,  WBC,  LACTICIDVEN Microbiology Recent Results (from the past 240 hour(s))  MRSA PCR Screening  Status: Abnormal   Collection Time: 11/07/20 10:13 PM   Specimen: Nasopharyngeal  Result Value Ref Range Status   MRSA by PCR POSITIVE (A) NEGATIVE Final    Comment:        The GeneXpert MRSA Assay (FDA approved for NASAL specimens only), is one component of a comprehensive MRSA colonization surveillance program. It is  not intended to diagnose MRSA infection nor to guide or monitor treatment for MRSA infections. RESULT CALLED TO, READ BACK BY AND VERIFIED WITH: [facility] CAMPBELL  11/08/2020 TTG Performed at [facility], [address] [place], [place] [address]   Resp Panel by RT-PCR (Flu A&B, Covid) Nasopharyngeal Swab     Status: None   Collection Time: 11/07/20 10:20 PM   Specimen: Nasopharyngeal Swab; Nasopharyngeal(NP) swabs in vial transport medium  Result Value Ref Range Status   SARS Coronavirus 2 by RT PCR NEGATIVE NEGATIVE Final    Comment: (NOTE) SARS-CoV-2 target nucleic acids are NOT DETECTED.  The SARS-CoV-2 RNA is generally detectable in upper respiratory specimens during the acute phase of infection. The lowest concentration of SARS-CoV-2 viral copies this assay can detect is 138 copies/mL. A negative result does not preclude SARS-Cov-2 infection and should not be used as the sole basis for treatment or other patient management decisions. A negative result may occur with  improper specimen collection/handling, submission of specimen other than nasopharyngeal swab, presence of viral mutation(s) within the areas targeted by this assay, and inadequate number of viral copies(<138 copies/mL). A negative result must be combined with clinical observations, patient history, and epidemiological information. The expected result is Negative.  Fact Sheet for Patients:  BloggerCourse.com  Fact Sheet for Healthcare Providers:  SeriousBroker.it  This test is no t yet approved or cleared by the [place] FDA and  has been authorized for detection and/or diagnosis of SARS-CoV-2 by FDA under an Emergency Use Authorization (EUA). This EUA will remain  in effect (meaning this test can be used) for the duration of the COVID-19 declaration under Section 564(b)(1) of the Act, 21 U.S.C.section 360bbb-3(b)(1), unless the  authorization is terminated  or revoked sooner.       Influenza A by PCR NEGATIVE NEGATIVE Final   Influenza B by PCR NEGATIVE NEGATIVE Final    Comment: (NOTE) The Xpert Xpress SARS-CoV-2/FLU/RSV plus assay is intended as an aid in the diagnosis of influenza from Nasopharyngeal swab specimens and should not be used as a sole basis for treatment. Nasal washings and aspirates are unacceptable for Xpert Xpress SARS-CoV-2/FLU/RSV testing.  Fact Sheet for Patients: BloggerCourse.com  Fact Sheet for Healthcare Providers: SeriousBroker.it  This test is not yet approved or cleared by the [place] FDA and has been authorized for detection and/or diagnosis of SARS-CoV-2 by FDA under an Emergency Use Authorization (EUA). This EUA will remain in effect (meaning this test can be used) for the duration of the COVID-19 declaration under Section 564(b)(1) of the Act, 21 U.S.C. section 360bbb-3(b)(1), unless the authorization is terminated or revoked.  Performed at [facility], [address] [place], [place] [address]   CULTURE, BLOOD (ROUTINE X 2) w Reflex to ID Panel     Status: None (Preliminary result)   Collection Time: 11/10/20  9:15 AM   Specimen: BLOOD  Result Value Ref Range Status   Specimen Description BLOOD LEFT HAND  Final   Special Requests   Final    BOTTLES DRAWN AEROBIC AND ANAEROBIC Blood Culture results may not be optimal due to an inadequate  volume of blood received in culture bottles   Culture   Final    NO GROWTH 3 DAYS Performed at [facility], [address] [place], [place] [address]    Report Status PENDING  Incomplete  CULTURE, BLOOD (ROUTINE X 2) w Reflex to ID Panel     Status: None (Preliminary result)   Collection Time: 11/10/20  9:16 AM   Specimen: BLOOD  Result Value Ref Range Status   Specimen Description BLOOD RIGHT HAND  Final   Special Requests   Final    BOTTLES DRAWN  AEROBIC AND ANAEROBIC Blood Culture results may not be optimal due to an inadequate volume of blood received in culture bottles   Culture   Final    NO GROWTH 3 DAYS Performed at [facility], [address] [place], [place] [address]    Report Status PENDING  Incomplete  Urine Culture     Status: None   Collection Time: 11/10/20 10:42 AM   Specimen: Urine, Random  Result Value Ref Range Status   Specimen Description   Final    URINE, RANDOM Performed at [facility], [address] [place], [place] [address]    Special Requests   Final    NONE Performed at [facility], [address] [place], [place] [address]    Culture   Final    NO GROWTH Performed at [facility], [address] [address] [place], [place] [address]    Report Status 11/11/2020 FINAL  Final     Time coordinating discharge: Over 30 minutes  SIGNED:   Charise KillianJamiese M Zanasia Hickson, MD  [facility] 11/13/2020, 12:44 PM Pager   If 7PM-7AM, please contact night-coverage [email or web address]

## 2020-11-12 NOTE — Plan of Care (Signed)
  Problem: Health Behavior/Discharge Planning: Goal: Ability to manage health-related needs will improve Outcome: Progressing   Problem: Clinical Measurements: Goal: Will remain free from infection Outcome: Progressing   Problem: Education: Goal: Knowledge of General Education information will improve Description Including pain rating scale, medication(s)/side effects and non-pharmacologic comfort measures Outcome: Progressing   

## 2020-11-12 NOTE — Progress Notes (Signed)
PROGRESS NOTE    Timothy Schultz  YIA:[id number] DOB: 1961/07/20 DOA: 11/07/2020 PCP: No primary care provider on file.   Assessment & Plan:   Active Problems:   Acute ST elevation myocardial infarction (STEMI) involving left anterior descending (LAD) coronary artery (HCC)   STEMI (ST elevation myocardial infarction) (HCC)  Acute STEMI: s/p cardiac cath w/ stents placed in proximal LAD. Continue on aspirin, brilinta, metoprolol, lisinopril. EF 30-35%. Awaiting life vest still today. Can be d/c home after pt receives life vest   Ischemic cardiomyopathy: s/p STEMI. EF 30-35%. Continue on metoprolol, lisinopril. Cardio following and recs apprec  Pneumonia: possibly aspiration. Continue on IV unasyn while inpatient will be switched to po augmentin at d/c.   Leukocytosis: likely secondary to infection. Continue on IV abxs  Tobacco smoker: smoking cessation counselingAcute STEMI: s/p cardiac cath w/ stents placed in proximal LAD. Continue on aspirin, brilinta, metoprolol, lisinopril. EF 30-35% Life vest will be delivered today.   Ischemic cardiomyopathy: s/p STEMI. EF 30-35%. Continue on metoprolol, lisinopril. Cardio following and recs apprec  Pneumonia: possibly aspiration. Continue on IV unasyn while inpatient will be switched to po augmentin at d/c.   Leukocytosis: likely secondary to infection. Continue on IV abxs  Tobacco smoker: smoking cessation counseling   DVT prophylaxis: lovenox  Code Status: full  Family Communication:  Disposition Plan: can d/c home when pt receives life vest  Status is: Inpatient  Remains inpatient appropriate because:Unsafe d/c plan   Dispo: The patient is from: Home              Anticipated d/c is to: Home              Anticipated d/c date is: 1 day, awaiting life vest still               Patient currently is not medically stable to d/c.         Consultants:  Cardio   Procedures:    Antimicrobials:  unasyn   Subjective: Pt c/o fatigue  Objective: Vitals:   11/12/20 0345 11/12/20 0744 11/12/20 1139 11/12/20 1608  BP: 103/70 116/75 105/82 104/62  Pulse: 92 85 79 86  Resp:  20 19 20   Temp: 98.3 F (36.8 C) 98.5 F (36.9 C) 97.8 F (36.6 C) 98.4 F (36.9 C)  TempSrc: Oral Oral Oral Oral  SpO2: 96% 98% 94% 98%  Weight: 75.3 kg     Height:        Intake/Output Summary (Last 24 hours) at 11/12/2020 1841 Last data filed at 11/12/2020 1500 Gross per 24 hour  Intake 441.83 ml  Output 700 ml  Net -258.17 ml   Filed Weights   11/10/20 0500 11/11/20 0333 11/12/20 0345  Weight: 77 kg 75.8 kg 75.3 kg    Examination:  General exam: Appears calm and comfortable  Respiratory system: diminished breath sounds b/l. Cardiovascular system: S1 & S2 +. No rubs, gallops or clicks.  Gastrointestinal system: Abdomen is nondistended, soft and nontender. Normal bowel sounds heard. Central nervous system: Alert and oriented. Moves all 4 extremities  Psychiatry: Judgement and insight appear normal. Mood & affect appropriate.     Data Reviewed: I have personally reviewed following labs and imaging studies  CBC: Recent Labs  Lab 11/07/20 2220 11/08/20 0542 11/10/20 0554 11/11/20 0603 11/12/20 0615  WBC 14.5* 13.7* 16.4* 13.2* 11.1*  NEUTROABS 6.2  --   --   --   --   HGB 13.3 14.0 12.7* 11.6* 11.7*  HCT 40.1  40.1 35.8* 33.7* 34.3*  MCV 91.6 89.5 88.8 89.6 90.3  PLT 302 264 195 194 239   Basic Metabolic Panel: Recent Labs  Lab 11/07/20 2220 11/08/20 0542 11/09/20 0419 11/10/20 0554 11/11/20 0603  NA 137 135 134*  --  134*  K 3.3* 3.9 4.0  --  3.6  CL 104 106 103  --  105  CO2 17* 18* 21*  --  19*  GLUCOSE 189* 130* 124*  --  119*  BUN 15 16 16   --  17  CREATININE 1.52* 1.22 1.15  --  1.22  CALCIUM 8.4* 8.9 8.7*  --  8.3*  MG  --   --   --  2.1  --    GFR: Estimated Creatinine Clearance: 70.3 mL/min (by C-G formula based on SCr of 1.22 mg/dL). Liver Function  Tests: No results for input(s): AST, ALT, ALKPHOS, BILITOT, PROT, ALBUMIN in the last 168 hours. No results for input(s): LIPASE, AMYLASE in the last 168 hours. No results for input(s): AMMONIA in the last 168 hours. Coagulation Profile: No results for input(s): INR, PROTIME in the last 168 hours. Cardiac Enzymes: No results for input(s): CKTOTAL, CKMB, CKMBINDEX, TROPONINI in the last 168 hours. BNP (last 3 results) No results for input(s): PROBNP in the last 8760 hours. HbA1C: No results for input(s): HGBA1C in the last 72 hours. CBG: Recent Labs  Lab 11/08/20 0010  GLUCAP 205*   Lipid Profile: No results for input(s): CHOL, HDL, LDLCALC, TRIG, CHOLHDL, LDLDIRECT in the last 72 hours. Thyroid Function Tests: No results for input(s): TSH, T4TOTAL, FREET4, T3FREE, THYROIDAB in the last 72 hours. Anemia Panel: No results for input(s): VITAMINB12, FOLATE, FERRITIN, TIBC, IRON, RETICCTPCT in the last 72 hours. Sepsis Labs: No results for input(s): PROCALCITON, LATICACIDVEN in the last 168 hours.  Recent Results (from the past 240 hour(s))  MRSA PCR Screening     Status: Abnormal   Collection Time: 11/07/20 10:13 PM   Specimen: Nasopharyngeal  Result Value Ref Range Status   MRSA by PCR POSITIVE (A) NEGATIVE Final    Comment:        The GeneXpert MRSA Assay (FDA approved for NASAL specimens only), is one component of a comprehensive MRSA colonization surveillance program. It is not intended to diagnose MRSA infection nor to guide or monitor treatment for MRSA infections. RESULT CALLED TO, READ BACK BY AND VERIFIED WITH: [facility] CAMPBELL @0337  11/08/2020 TTG Performed at [facility], [address] [place], [address] [place]   Resp Panel by RT-PCR (Flu A&B, Covid) Nasopharyngeal Swab     Status: None   Collection Time: 11/07/20 10:20 PM   Specimen: Nasopharyngeal Swab; Nasopharyngeal(NP) swabs in vial transport medium  Result Value Ref Range Status   SARS  Coronavirus 2 by RT PCR NEGATIVE NEGATIVE Final    Comment: (NOTE) SARS-CoV-2 target nucleic acids are NOT DETECTED.  The SARS-CoV-2 RNA is generally detectable in upper respiratory specimens during the acute phase of infection. The lowest concentration of SARS-CoV-2 viral copies this assay can detect is 138 copies/mL. A negative result does not preclude SARS-Cov-2 infection and should not be used as the sole basis for treatment or other patient management decisions. A negative result may occur with  improper specimen collection/handling, submission of specimen other than nasopharyngeal swab, presence of viral mutation(s) within the areas targeted by this assay, and inadequate number of viral copies(<138 copies/mL). A negative result must be combined with clinical observations, patient history, and epidemiological information. The expected result  is Negative.  Fact Sheet for Patients:  BloggerCourse.comhttps://www.fda.gov/media/152166/download  Fact Sheet for Healthcare Providers:  SeriousBroker.ithttps://www.fda.gov/media/152162/download  This test is no t yet approved or cleared by the [place] FDA and  has been authorized for detection and/or diagnosis of SARS-CoV-2 by FDA under an Emergency Use Authorization (EUA). This EUA will remain  in effect (meaning this test can be used) for the duration of the COVID-19 declaration under Section 564(b)(1) of the Act, 21 U.S.C.section 360bbb-3(b)(1), unless the authorization is terminated  or revoked sooner.       Influenza A by PCR NEGATIVE NEGATIVE Final   Influenza B by PCR NEGATIVE NEGATIVE Final    Comment: (NOTE) The Xpert Xpress SARS-CoV-2/FLU/RSV plus assay is intended as an aid in the diagnosis of influenza from Nasopharyngeal swab specimens and should not be used as a sole basis for treatment. Nasal washings and aspirates are unacceptable for Xpert Xpress SARS-CoV-2/FLU/RSV testing.  Fact Sheet for  Patients: BloggerCourse.comhttps://www.fda.gov/media/152166/download  Fact Sheet for Healthcare Providers: SeriousBroker.ithttps://www.fda.gov/media/152162/download  This test is not yet approved or cleared by the [place] FDA and has been authorized for detection and/or diagnosis of SARS-CoV-2 by FDA under an Emergency Use Authorization (EUA). This EUA will remain in effect (meaning this test can be used) for the duration of the COVID-19 declaration under Section 564(b)(1) of the Act, 21 U.S.C. section 360bbb-3(b)(1), unless the authorization is terminated or revoked.  Performed at [facility], [address] [place], [place] [address]   CULTURE, BLOOD (ROUTINE X 2) w Reflex to ID Panel     Status: None (Preliminary result)   Collection Time: 11/10/20  9:15 AM   Specimen: BLOOD  Result Value Ref Range Status   Specimen Description BLOOD LEFT HAND  Final   Special Requests   Final    BOTTLES DRAWN AEROBIC AND ANAEROBIC Blood Culture results may not be optimal due to an inadequate volume of blood received in culture bottles   Culture   Final    NO GROWTH 2 DAYS Performed at [facility], [address] [place], [place] [address]    Report Status PENDING  Incomplete  CULTURE, BLOOD (ROUTINE X 2) w Reflex to ID Panel     Status: None (Preliminary result)   Collection Time: 11/10/20  9:16 AM   Specimen: BLOOD  Result Value Ref Range Status   Specimen Description BLOOD RIGHT HAND  Final   Special Requests   Final    BOTTLES DRAWN AEROBIC AND ANAEROBIC Blood Culture results may not be optimal due to an inadequate volume of blood received in culture bottles   Culture   Final    NO GROWTH 2 DAYS Performed at [facility], [address] [place], [place] [address]    Report Status PENDING  Incomplete  Urine Culture     Status: None   Collection Time: 11/10/20 10:42 AM   Specimen: Urine, Random  Result Value Ref Range Status   Specimen Description   Final    URINE,  RANDOM Performed at [facility], [address] [place], [place] [address]    Special Requests   Final    NONE Performed at [facility], [address] [place], [place] [address]    Culture   Final    NO GROWTH Performed at [facility], [address] [place], [place] [address]    Report Status 11/11/2020 FINAL  Final         Radiology Studies: No results  found.      Scheduled Meds: . aspirin EC  81 mg Oral Daily  . atorvastatin  80 mg Oral Daily  . buprenorphine-naloxone  1 tablet Sublingual Daily  . Chlorhexidine Gluconate Cloth  6 each Topical Q0600  . Chlorhexidine Gluconate Cloth  6 each Topical Q0600  . enoxaparin (LOVENOX) injection  40 mg Subcutaneous Q24H  . lisinopril  2.5 mg Oral Daily  . metoprolol succinate  12.5 mg Oral Daily  . mupirocin ointment  1 application Nasal BID  . sodium chloride flush  3 mL Intravenous Q12H  . ticagrelor  90 mg Oral BID   Continuous Infusions: . sodium chloride Stopped (11/12/20 1530)  . ampicillin-sulbactam (UNASYN) IV 3 g (11/12/20 1454)     LOS: 5 days    Time spent: 32 mins     Charise Killian, MD [facility] Pager [phone]  If 7PM-7AM, please contact night-coverage 11/12/2020, 6:41 PM

## 2020-11-12 NOTE — Progress Notes (Addendum)
   Heart Failure Nurse Navigator Note  HFrEF 30-35%  Called EMS due to worsening chest discomfort.  Experienced out of hospital cardiac arrest and was taken directly to the cardiac catheterization lab where he received lapping drug-eluting stents to the proximal LAD.  Comorbidities  Tobacco abuse   Medications:  Enteric-coated aspirin 81 mg daily Lipitor 80 mg daily Zestril 2.5 mg daily metoprolol succinate 12-1/2 mg daily Brilinta 90 mg 2 times a day   Labs:   CBC 11.1 down from 13.2, hemoglobin 11.7, hematocrit 34.3 On 01/13/2020 sodium 134, potassium 3.6, chloride 105, CO2 19, BUN 17, creatinine 1.22, Intake 801 mL Output 1200 mL Weight 75.3 down from 75.8 of yesterday BMI 23.15 Blood pressure 105/82   Assessment:  General he is awake and alert, in no acute distress.  HEENT-pulse are equal, normocephalic, no JVD.  Sclera of the left eye is reddened and bloody looking but he states that this is a chronic condition.   Cardiac-heart tones are regular rate and rhythm.  Chest-breath sounds are clear to posterior auscultation.  Musculoskeletal- there is no lower extremity edema.  Psych-he is pleasant and appropriate makes good eye contact.  Neurologic-speech is clear moves all extremities without difficulty.    Spoke with patient concerning LifeVest, still has not received and it was ordered on Monday, December 13.  Call into [employer] rep  Fredirick Maudlin, she states they have not received the order.  Sent order along with documentation that they requested H&P, consult, and progress note, cath report and echocardiogram report.  Copy of his insurance card along with the demographic sheet was also sent.   In speaking to the rep she states that she should be able to fit him with a LifeVest today.  Patient's daughter at the bedside also had questions about his heart failure cares, she has read that teaching booklet went over the important items  with him taking care of  himself.  She voices understanding     Tresa Endo RN, CHFN

## 2020-11-13 LAB — BASIC METABOLIC PANEL
Anion gap: 12 (ref 5–15)
BUN: 13 mg/dL (ref 6–20)
CO2: 20 mmol/L — ABNORMAL LOW (ref 22–32)
Calcium: 8.4 mg/dL — ABNORMAL LOW (ref 8.9–10.3)
Chloride: 104 mmol/L (ref 98–111)
Creatinine, Ser: 1.04 mg/dL (ref 0.61–1.24)
GFR, Estimated: >60 mL/min (ref >60)
Glucose, Bld: 94 mg/dL (ref 70–99)
Potassium: 3.4 mmol/L — ABNORMAL LOW (ref 3.5–5.1)
Sodium: 136 mmol/L (ref 135–145)

## 2020-11-13 LAB — CBC
HCT: 34.2 % — ABNORMAL LOW (ref 39.0–52.0)
Hemoglobin: 11.9 g/dL — ABNORMAL LOW (ref 13.0–17.0)
MCH: 31.1 pg (ref 26.0–34.0)
MCHC: 34.8 g/dL (ref 30.0–36.0)
MCV: 89.3 fL (ref 80.0–100.0)
Platelets: 303 10*3/uL (ref 150–400)
RBC: 3.83 MIL/uL — ABNORMAL LOW (ref 4.22–5.81)
RDW: 12.9 % (ref 11.5–15.5)
WBC: 9.4 10*3/uL (ref 4.0–10.5)
nRBC: 0 % (ref 0.0–0.2)

## 2020-11-13 MED ORDER — ATORVASTATIN CALCIUM 80 MG PO TABS: 80.0000 mg | ORAL_TABLET | Freq: Every day | ORAL | 0 refills | Status: DC

## 2020-11-13 MED ORDER — ASPIRIN 81 MG PO TBEC: 81.0000 mg | DELAYED_RELEASE_TABLET | Freq: Every day | ORAL | 0 refills | Status: AC

## 2020-11-13 MED ORDER — TICAGRELOR 90 MG PO TABS: 90.0000 mg | ORAL_TABLET | Freq: Two times a day (BID) | ORAL | 0 refills | Status: AC

## 2020-11-13 MED ORDER — LISINOPRIL 2.5 MG PO TABS: 2.5000 mg | ORAL_TABLET | Freq: Every day | ORAL | 0 refills | Status: DC

## 2020-11-13 MED ORDER — AMOXICILLIN-POT CLAVULANATE 875-125 MG PO TABS: 1.0000 | ORAL_TABLET | Freq: Two times a day (BID) | ORAL | 0 refills | Status: AC

## 2020-11-13 MED ORDER — METOPROLOL SUCCINATE ER 25 MG PO TB24: 12.5000 mg | ORAL_TABLET | Freq: Every day | ORAL | 0 refills | Status: DC

## 2020-11-13 NOTE — Progress Notes (Signed)
Patient discharged at this time via ambulatory route to home with all belongings. Patient verbalized understanding of discharge instructions and follow up appointments and new teachings regarding life vest. PIV/Tele removed by Clinical research associate. NAD upon leaving.

## 2020-11-15 LAB — CULTURE, BLOOD (ROUTINE X 2)
Culture: NO GROWTH
Culture: NO GROWTH

## 2020-11-17 NOTE — Progress Notes (Signed)
Patient ID: Timothy Schultz, male    DOB: Dec 09, 1960, 59 y.o.   MRN: [id number]  HPI  Mr Tubbs is a 59 y/o male with a history of CAD (MI/ stent), recent tobacco use and chronic heart failure.   Echo report from 11/08/20 reviewed and showed an EF of 30-35%.  LHC completed 11/07/20 and showed:  Prox LAD to Mid LAD lesion is 100% stenosed.  2nd Diag lesion is 70% stenosed.  1st Mrg lesion is 75% stenosed.  Prox Cx to Mid Cx lesion is 60% stenosed.  A drug-eluting stent was successfully placed using a STENT SYNERGY DES 3X28.  Post intervention, there is a 0% residual stenosis.  Mid LAD-1 lesion is 70% stenosed.  Mid LAD-2 lesion is 40% stenosed.  A drug-eluting stent was successfully placed using a STENT SYNERGY DES 2.50X8.  Post intervention, there is a 0% residual stenosis.  Mid LM lesion is 30% stenosed.  Prox RCA lesion is 70% stenosed.   1.  Anterior ST elevation myocardial infarction 2.  Occluded proximal LAD 3.  Preserved left ventricular function 4.  Successful primary PCI with overlapping 3,0 x 28 mm and 2.5 x 8 mm Synergy stents proximal/mid LAD  Admitted 11/07/20 due to STEMI. Cardiology consult obtained & taken directly to cath lab. Found to have occluded proximal LAD and had placement of 2 overlapping stents. Received 3 shocks due to VT/VF along with amiodarone bolus followed by amiodarone drip. Lifevest was placed. Given IV antibiotics for pneumonia with transition to oral antibiotics. Discharged after 6 days.   He presents today for his initial visit with a chief complaint of minimal fatigue upon moderate exertion. He describes this as having been present for a few weeks and he's trying to slowly get his activity back up. Currently has no other symptoms and specifically denies any difficulty sleeping, dizziness, abdominal distention, pedal edema, chest pain, shortness of breath, cough or weight gain.   Does not add additional salt to his food and has been  diligent about reading food labels. Hasn't smoked since his recent admission.   States that his brilinta is very expensive and is asking what his other options were.   Past Medical History:  Diagnosis Date  . CHF (congestive heart failure) (HCC)   . Coronary artery disease   . Tobacco use    Past Surgical History:  Procedure Laterality Date  . CORONARY/GRAFT ACUTE MI REVASCULARIZATION N/A 11/07/2020   Procedure: Coronary/Graft Acute MI Revascularization;  Surgeon: Marcina Millard, MD;  Location: [facility] INVASIVE CV LAB;  Service: Cardiovascular;  Laterality: N/A;  . LEFT HEART CATH AND CORONARY ANGIOGRAPHY N/A 11/07/2020   Procedure: LEFT HEART CATH AND CORONARY ANGIOGRAPHY;  Surgeon: Marcina Millard, MD;  Location: [facility] INVASIVE CV LAB;  Service: Cardiovascular;  Laterality: N/A;   History reviewed. No pertinent family history. Social History   Tobacco Use  . Smoking status: Current Every Day Smoker  . Smokeless tobacco: Never Used  Substance Use Topics  . Alcohol use: Not Currently   No Known Allergies Prior to Admission medications   Medication Sig Start Date End Date Taking? Authorizing Provider  aspirin EC 81 MG EC tablet Take 1 tablet (81 mg total) by mouth daily. Swallow whole. 11/13/20 12/13/20 Yes Charise Killian, MD  atorvastatin (LIPITOR) 80 MG tablet Take 1 tablet (80 mg total) by mouth daily. 11/13/20 12/13/20 Yes Charise Killian, MD  buprenorphine (SUBUTEX) 8 MG SUBL SL tablet Place 8 mg under the tongue daily. 10/21/20  Yes  [provider]  lisinopril (ZESTRIL) 2.5 MG tablet Take 1 tablet (2.5 mg total) by mouth daily. 11/13/20 12/13/20 Yes Charise Killian, MD  metoprolol succinate (TOPROL-XL) 25 MG 24 hr tablet Take 0.5 tablets (12.5 mg total) by mouth daily. 11/13/20 12/13/20 Yes Charise Killian, MD  ticagrelor (BRILINTA) 90 MG TABS tablet Take 1 tablet (90 mg total) by mouth 2 (two) times daily. 11/13/20 12/13/20 Yes Charise Killian,  MD     Review of Systems  Constitutional: Positive for fatigue. Negative for appetite change.  HENT: Negative for congestion, postnasal drip and sore throat.   Eyes: Negative.   Respiratory: Negative for cough and shortness of breath.   Cardiovascular: Negative for chest pain, palpitations and leg swelling.  Gastrointestinal: Negative for abdominal distention and abdominal pain.  Endocrine: Negative.   Genitourinary: Negative.   Musculoskeletal: Negative for back pain and neck pain.  Skin: Negative.   Allergic/Immunologic: Negative.   Neurological: Negative for dizziness and light-headedness.  Hematological: Negative for adenopathy. Does not bruise/bleed easily.  Psychiatric/Behavioral: Negative for dysphoric mood and sleep disturbance (sleeping on 1 pillow). The patient is not nervous/anxious.     Vitals:   11/18/20 0856  BP: 109/83  Pulse: 85  Resp: 20  SpO2: 100%  Weight: 166 lb 4 oz (75.4 kg)  Height: 5\' 11"  (1.803 m)   Wt Readings from Last 3 Encounters:  11/18/20 166 lb 4 oz (75.4 kg)  11/13/20 165 lb (74.8 kg)   Lab Results  Component Value Date   CREATININE 1.04 11/13/2020   CREATININE 1.22 11/11/2020   CREATININE 1.15 11/09/2020    Physical Exam Vitals and nursing note reviewed. Exam conducted with a chaperone present (daughter is present).  Constitutional:      Appearance: Normal appearance.  HENT:     Head: Normocephalic and atraumatic.  Cardiovascular:     Rate and Rhythm: Normal rate and regular rhythm.  Pulmonary:     Effort: Pulmonary effort is normal. No respiratory distress.     Breath sounds: No wheezing or rales.  Abdominal:     General: Abdomen is flat. There is no distension.     Palpations: Abdomen is soft.  Musculoskeletal:        General: No tenderness.     Cervical back: Normal range of motion and neck supple.     Right lower leg: No edema.     Left lower leg: No edema.  Skin:    General: Skin is warm and dry.  Neurological:      General: No focal deficit present.     Mental Status: He is alert and oriented to person, place, and time.  Psychiatric:        Mood and Affect: Mood normal.        Behavior: Behavior normal.        Thought Content: Thought content normal.    Assessment & Plan:  1: Chronic heart failure with reduced ejection fraction- - NYHA class II - euvolemic today - weighing daily; reminded to call for an overnight weight gain of > 2 pounds or a weekly weight gain of >5 pounds - not adding salt and is using Mrs. Dash for seasoning; has been reading food labels for sodium content; reviewed the importance of keeping daily sodium intake to ~ 2000mg  / day. Written dietary information given to him about this - informed to keep daily fluid intake to ~ 60-64 ounces/ day - doubt current soft BP could tolerate changing lisinopril to  entresto or even titrating lisinopril - discussed possibly switching to entresto in the future, titrating metoprolol, adding spironolactone/ farxiga if BP allows - still wearing lifevest and denies having any alarms or shocks - does not get the flu vaccine - not interested in covid vaccines at this time  2: CAD- - to see cardiology ([facility]) 11/24/20 - had 2 recent stents placed - BMP 11/13/20 reviewed and showed sodium 136, potassium 3.4, creatinine 1.04 and GFR >60 - brochure given about cardiac rehab and he will discuss with Dr. Darrold Junker - brilinta patient assistance paperwork given to patient and he will look into it; he will also discuss with provider at upcoming cardiology appointment  3: Tobacco use- - says that he hasn't smoked any since his recent admission - congratulated on that and continued cessation encouraged   Patient did not bring his medications nor a list. Each medication was verbally reviewed with the patient and he was encouraged to bring the bottles to every visit to confirm accuracy of list.  Return in 1 month or sooner for any questions/problems  before then.

## 2020-11-18 ENCOUNTER — Ambulatory Visit: Payer: 59 | Attending: Family | Admitting: Family

## 2020-11-18 ENCOUNTER — Other Ambulatory Visit: Payer: Self-pay

## 2020-11-18 ENCOUNTER — Encounter: Payer: Self-pay | Admitting: Family

## 2020-11-18 VITALS — BP 109/83 | HR 85 | Resp 20 | Ht 71.0 in | Wt 166.2 lb

## 2020-11-18 DIAGNOSIS — I2510 Atherosclerotic heart disease of native coronary artery without angina pectoris: Secondary | ICD-10-CM | POA: Diagnosis not present

## 2020-11-18 DIAGNOSIS — Z7982 Long term (current) use of aspirin: Secondary | ICD-10-CM | POA: Insufficient documentation

## 2020-11-18 DIAGNOSIS — I5022 Chronic systolic (congestive) heart failure: Secondary | ICD-10-CM | POA: Diagnosis present

## 2020-11-18 DIAGNOSIS — F17200 Nicotine dependence, unspecified, uncomplicated: Secondary | ICD-10-CM | POA: Diagnosis not present

## 2020-11-18 DIAGNOSIS — Z720 Tobacco use: Secondary | ICD-10-CM

## 2020-11-18 DIAGNOSIS — I252 Old myocardial infarction: Secondary | ICD-10-CM | POA: Diagnosis not present

## 2020-11-18 DIAGNOSIS — Z79899 Other long term (current) drug therapy: Secondary | ICD-10-CM | POA: Diagnosis not present

## 2020-11-18 DIAGNOSIS — Z955 Presence of coronary angioplasty implant and graft: Secondary | ICD-10-CM | POA: Insufficient documentation

## 2020-11-18 NOTE — Patient Instructions (Addendum)
Continue weighing daily and call for an overnight weight gain of > 2 pounds or a weekly weight gain of >5 pounds.   Drink between 60-64 ounces of fluids per day    Low-Sodium Eating Plan Sodium, which is an element that makes up salt, helps you maintain a healthy balance of fluids in your body. Too much sodium can increase your blood pressure and cause fluid and waste to be held in your body. Your health care provider or dietitian may recommend following this plan if you have high blood pressure (hypertension), kidney disease, liver disease, or heart failure. Eating less sodium can help lower your blood pressure, reduce swelling, and protect your heart, liver, and kidneys. What are tips for following this plan? General guidelines  Most people on this plan should limit their sodium intake to 2,000 mg (milligrams) of sodium each day. Reading food labels   The Nutrition Facts label lists the amount of sodium in one serving of the food. If you eat more than one serving, you must multiply the listed amount of sodium by the number of servings.  Choose foods with less than 140 mg of sodium per serving.  Avoid foods with 300 mg of sodium or more per serving. Shopping  Look for lower-sodium products, often labeled as "low-sodium" or "no salt added."  Always check the sodium content even if foods are labeled as "unsalted" or "no salt added".  Buy fresh foods. ? Avoid canned foods and premade or frozen meals. ? Avoid canned, cured, or processed meats  Buy breads that have less than 80 mg of sodium per slice. Cooking  Eat more home-cooked food and less restaurant, buffet, and fast food.  Avoid adding salt when cooking. Use salt-free seasonings or herbs instead of table salt or sea salt. Check with your health care provider or pharmacist before using salt substitutes.  Cook with plant-based oils, such as canola, sunflower, or olive oil. Meal planning  When eating at a restaurant, ask  that your food be prepared with less salt or no salt, if possible.  Avoid foods that contain MSG (monosodium glutamate). MSG is sometimes added to [place] food, bouillon, and some canned foods. What foods are recommended? The items listed may not be a complete list. Talk with your dietitian about what dietary choices are best for you. Grains Low-sodium cereals, including oats, puffed wheat and rice, and shredded wheat. Low-sodium crackers. Unsalted rice. Unsalted pasta. Low-sodium bread. Whole-grain breads and whole-grain pasta. Vegetables Fresh or frozen vegetables. "No salt added" canned vegetables. "No salt added" tomato sauce and paste. Low-sodium or reduced-sodium tomato and vegetable juice. Fruits Fresh, frozen, or canned fruit. Fruit juice. Meats and other protein foods Fresh or frozen (no salt added) meat, poultry, seafood, and fish. Low-sodium canned tuna and salmon. Unsalted nuts. Dried peas, beans, and lentils without added salt. Unsalted canned beans. Eggs. Unsalted nut butters. Dairy Milk. Soy milk. Cheese that is naturally low in sodium, such as ricotta cheese, fresh mozzarella, or Swiss cheese Low-sodium or reduced-sodium cheese. Cream cheese. Yogurt. Fats and oils Unsalted butter. Unsalted margarine with no trans fat. Vegetable oils such as canola or olive oils. Seasonings and other foods Fresh and dried herbs and spices. Salt-free seasonings. Low-sodium mustard and ketchup. Sodium-free salad dressing. Sodium-free light mayonnaise. Fresh or refrigerated horseradish. Lemon juice. Vinegar. Homemade, reduced-sodium, or low-sodium soups. Unsalted popcorn and pretzels. Low-salt or salt-free chips. What foods are not recommended? The items listed may not be a complete list. Talk with your dietitian  about what dietary choices are best for you. Grains Instant hot cereals. Bread stuffing, pancake, and biscuit mixes. Croutons. Seasoned rice or pasta mixes. Noodle soup cups. Boxed or  frozen macaroni and cheese. Regular salted crackers. Self-rising flour. Vegetables Sauerkraut, pickled vegetables, and relishes. Olives. [place] fries. Onion rings. Regular canned vegetables (not low-sodium or reduced-sodium). Regular canned tomato sauce and paste (not low-sodium or reduced-sodium). Regular tomato and vegetable juice (not low-sodium or reduced-sodium). Frozen vegetables in sauces. Meats and other protein foods Meat or fish that is salted, canned, smoked, spiced, or pickled. Bacon, ham, sausage, hotdogs, corned beef, chipped beef, packaged lunch meats, salt pork, jerky, pickled herring, anchovies, regular canned tuna, sardines, salted nuts. Dairy Processed cheese and cheese spreads. Cheese curds. Blue cheese. Feta cheese. String cheese. Regular cottage cheese. Buttermilk. Canned milk. Fats and oils Salted butter. Regular margarine. Ghee. Bacon fat. Seasonings and other foods Onion salt, garlic salt, seasoned salt, table salt, and sea salt. Canned and packaged gravies. Worcestershire sauce. Tartar sauce. Barbecue sauce. Teriyaki sauce. Soy sauce, including reduced-sodium. Steak sauce. Fish sauce. Oyster sauce. Cocktail sauce. Horseradish that you find on the shelf. Regular ketchup and mustard. Meat flavorings and tenderizers. Bouillon cubes. Hot sauce and Tabasco sauce. Premade or packaged marinades. Premade or packaged taco seasonings. Relishes. Regular salad dressings. Salsa. Potato and tortilla chips. Corn chips and puffs. Salted popcorn and pretzels. Canned or dried soups. Pizza. Frozen entrees and pot pies. Summary  Eating less sodium can help lower your blood pressure, reduce swelling, and protect your heart, liver, and kidneys.  Most people on this plan should limit their sodium intake to 1,500-2,000 mg (milligrams) of sodium each day.  Canned, boxed, and frozen foods are high in sodium. Restaurant foods, fast foods, and pizza are also very high in sodium. You also get sodium by  adding salt to food.  Try to cook at home, eat more fresh fruits and vegetables, and eat less fast food, canned, processed, or prepared foods. This information is not intended to replace advice given to you by your health care provider. Make sure you discuss any questions you have with your health care provider. Document Revised: 10/28/2017 Document Reviewed: 11/08/2016 Elsevier Patient Education  2020 [employer].

## 2020-12-18 ENCOUNTER — Ambulatory Visit: Payer: 59 | Admitting: Family

## 2020-12-30 ENCOUNTER — Ambulatory Visit: Payer: 59 | Admitting: Family

## 2020-12-30 ENCOUNTER — Encounter: Payer: Self-pay | Admitting: Family

## 2020-12-30 ENCOUNTER — Ambulatory Visit: Payer: 59 | Attending: Family | Admitting: Family

## 2020-12-30 ENCOUNTER — Other Ambulatory Visit: Payer: Self-pay

## 2020-12-30 VITALS — BP 118/88 | HR 77 | Resp 20 | Ht 70.0 in | Wt 165.1 lb

## 2020-12-30 DIAGNOSIS — I2510 Atherosclerotic heart disease of native coronary artery without angina pectoris: Secondary | ICD-10-CM | POA: Diagnosis not present

## 2020-12-30 DIAGNOSIS — Z8616 Personal history of COVID-19: Secondary | ICD-10-CM | POA: Insufficient documentation

## 2020-12-30 DIAGNOSIS — Z955 Presence of coronary angioplasty implant and graft: Secondary | ICD-10-CM | POA: Diagnosis not present

## 2020-12-30 DIAGNOSIS — Z79899 Other long term (current) drug therapy: Secondary | ICD-10-CM | POA: Insufficient documentation

## 2020-12-30 DIAGNOSIS — Z87891 Personal history of nicotine dependence: Secondary | ICD-10-CM | POA: Diagnosis not present

## 2020-12-30 DIAGNOSIS — I5022 Chronic systolic (congestive) heart failure: Secondary | ICD-10-CM | POA: Insufficient documentation

## 2020-12-30 DIAGNOSIS — I252 Old myocardial infarction: Secondary | ICD-10-CM | POA: Diagnosis not present

## 2020-12-30 DIAGNOSIS — Z720 Tobacco use: Secondary | ICD-10-CM

## 2020-12-30 MED ORDER — SPIRONOLACTONE 25 MG PO TABS: 25.0000 mg | ORAL_TABLET | Freq: Every day | ORAL | 3 refills | Status: DC

## 2020-12-30 NOTE — Progress Notes (Signed)
Patient ID: Timothy Schultz, male    DOB: 1961-04-04, 60 y.o.   MRN: [id number]  HPI  Timothy Schultz is a 60 y/o male with a history of CAD (MI/ stent), recent tobacco use and chronic heart failure.   Echo report from 11/08/20 reviewed and showed an EF of 30-35%.  LHC completed 11/07/20 and showed:  Prox LAD to Mid LAD lesion is 100% stenosed.  2nd Diag lesion is 70% stenosed.  1st Mrg lesion is 75% stenosed.  Prox Cx to Mid Cx lesion is 60% stenosed.  A drug-eluting stent was successfully placed using a STENT SYNERGY DES 3X28.  Post intervention, there is a 0% residual stenosis.  Mid LAD-1 lesion is 70% stenosed.  Mid LAD-2 lesion is 40% stenosed.  A drug-eluting stent was successfully placed using a STENT SYNERGY DES 2.50X8.  Post intervention, there is a 0% residual stenosis.  Mid LM lesion is 30% stenosed.  Prox RCA lesion is 70% stenosed.   1.  Anterior ST elevation myocardial infarction 2.  Occluded proximal LAD 3.  Preserved left ventricular function 4.  Successful primary PCI with overlapping 3,0 x 28 mm and 2.5 x 8 mm Synergy stents proximal/mid LAD  Admitted 11/07/20 due to STEMI. Cardiology consult obtained & taken directly to cath lab. Found to have occluded proximal LAD and had placement of 2 overlapping stents. Received 3 shocks due to VT/VF along with amiodarone bolus followed by amiodarone drip. Lifevest was placed. Given IV antibiotics for pneumonia with transition to oral antibiotics. Discharged after 6 days.   He presents today for a follow-up visit with a chief complaint of minimal fatigue upon moderate exertion. He describes this as chronic in nature having been present for several months. Currently without any other symptoms and specifically denies any difficulty sleeping, dizziness, abdominal distention, palpitations, pedal edema, chest pain, shortness of breath, cough or weight gain.   Says that he had covid a few weeks ago and lost quite a bit of  weight because of a decreased appetite and loss of taste. Feels like his weight is returning to his baseline.   Past Medical History:  Diagnosis Date  . CHF (congestive heart failure) (HCC)   . Coronary artery disease   . Tobacco use    Past Surgical History:  Procedure Laterality Date  . CORONARY/GRAFT ACUTE MI REVASCULARIZATION N/A 11/07/2020   Procedure: Coronary/Graft Acute MI Revascularization;  Surgeon: Marcina Millard, MD;  Location: [facility] INVASIVE CV [facility];  Service: Cardiovascular;  Laterality: N/A;  . LEFT HEART CATH AND CORONARY ANGIOGRAPHY N/A 11/07/2020   Procedure: LEFT HEART CATH AND CORONARY ANGIOGRAPHY;  Surgeon: Marcina Millard, MD;  Location: [facility] INVASIVE CV LAB;  Service: Cardiovascular;  Laterality: N/A;   No family history on file. Social History   Tobacco Use  . Smoking status: Current Every Day Smoker  . Smokeless tobacco: Never Used  Substance Use Topics  . Alcohol use: Not Currently   No Known Allergies  Prior to Admission medications   Medication Sig Start Date End Date Taking? Authorizing Provider  atorvastatin (LIPITOR) 80 MG tablet Take 1 tablet (80 mg total) by mouth daily. 11/13/20 12/13/20 Yes Charise Killian, MD  buprenorphine (SUBUTEX) 8 MG SUBL SL tablet Place 8 mg under the tongue daily. 10/21/20  Yes [provider]  lisinopril (ZESTRIL) 2.5 MG tablet Take 1 tablet (2.5 mg total) by mouth daily. 11/13/20 12/13/20 Yes Charise Killian, MD  metoprolol succinate (TOPROL-XL) 25 MG 24 hr tablet Take 0.5 tablets (  12.5 mg total) by mouth daily. 11/13/20 12/13/20 Yes Charise Killian, MD    Review of Systems  Constitutional: Positive for fatigue. Negative for appetite change.  HENT: Negative for congestion, postnasal drip and sore throat.   Eyes: Negative.   Respiratory: Negative for cough and shortness of breath.   Cardiovascular: Negative for chest pain, palpitations and leg swelling.  Gastrointestinal: Negative for  abdominal distention and abdominal pain.  Endocrine: Negative.   Genitourinary: Negative.   Musculoskeletal: Negative for back pain and neck pain.  Skin: Negative.   Allergic/Immunologic: Negative.   Neurological: Negative for dizziness and light-headedness.  Hematological: Negative for adenopathy. Does not bruise/bleed easily.  Psychiatric/Behavioral: Negative for dysphoric mood and sleep disturbance (sleeping on 1 pillow). The patient is not nervous/anxious.    Vitals:   12/30/20 1304  BP: 118/88  Pulse: 77  Resp: 20  SpO2: 99%  Weight: 165 lb 2 oz (74.9 kg)  Height: 5\' 10"  (1.778 m)   Wt Readings from Last 3 Encounters:  12/30/20 165 lb 2 oz (74.9 kg)  11/18/20 166 lb 4 oz (75.4 kg)  11/13/20 165 lb (74.8 kg)   Lab Results  Component Value Date   CREATININE 1.04 11/13/2020   CREATININE 1.22 11/11/2020   CREATININE 1.15 11/09/2020    Physical Exam Vitals and nursing note reviewed.  Constitutional:      Appearance: Normal appearance.  HENT:     Head: Normocephalic and atraumatic.  Cardiovascular:     Rate and Rhythm: Normal rate and regular rhythm.  Pulmonary:     Effort: Pulmonary effort is normal. No respiratory distress.     Breath sounds: No wheezing or rales.  Abdominal:     General: Abdomen is flat. There is no distension.     Palpations: Abdomen is soft.  Musculoskeletal:        General: No tenderness.     Cervical back: Normal range of motion and neck supple.     Right lower leg: No edema.     Left lower leg: No edema.  Skin:    General: Skin is warm and dry.  Neurological:     General: No focal deficit present.     Mental Status: He is alert and oriented to person, place, and time.  Psychiatric:        Mood and Affect: Mood normal.        Behavior: Behavior normal.        Thought Content: Thought content normal.    Assessment & Plan:  1: Chronic heart failure with reduced ejection fraction- - NYHA class II - euvolemic today - weighing daily;  reminded to call for an overnight weight gain of > 2 pounds or a weekly weight gain of >5 pounds - weight stable from last visit 6 weeks ago - not adding salt and is using Mrs. Dash for seasoning; has been reading food labels for sodium content; reviewed the importance of keeping daily sodium intake to ~ 2000mg  / day.  - saw cardiology (Paraschos) 11/24/20 - will add spironolactone 25mg  daily; will check BMP at next visit - discussed titrating metoprolol, changing lisinopril to entresto or adding SGLT2 at next visit - currently not wearing lifevest; says it became too aggravating to put it on and keep it on - does not get the flu vaccine - had covid a few weeks ago; advised him to wait 30 days before getting vaccinated  2: CAD- - had 2 recent stents placed - BMP 11/13/20 reviewed and  showed sodium 136, potassium 3.4, creatinine 1.04 and GFR >60  3: Tobacco use- - says that he hasn't smoked any since his recent admission - congratulated on that and continued cessation encouraged   Medication bottles reviewed.   Return in 2 weeks or sooner for any questions/problems before then

## 2020-12-30 NOTE — Patient Instructions (Addendum)
Continue weighing daily and call for an overnight weight gain of > 2 pounds or a weekly weight gain of >5 pounds.  Start spironolactone 25mg  once a day

## 2021-01-15 ENCOUNTER — Ambulatory Visit: Payer: 59 | Attending: Family | Admitting: Family

## 2021-01-15 ENCOUNTER — Other Ambulatory Visit: Payer: Self-pay

## 2021-01-15 ENCOUNTER — Encounter: Payer: Self-pay | Admitting: Family

## 2021-01-15 VITALS — BP 112/77 | HR 61 | Resp 18 | Ht 71.0 in | Wt 165.2 lb

## 2021-01-15 DIAGNOSIS — I5022 Chronic systolic (congestive) heart failure: Secondary | ICD-10-CM | POA: Insufficient documentation

## 2021-01-15 DIAGNOSIS — Z720 Tobacco use: Secondary | ICD-10-CM

## 2021-01-15 DIAGNOSIS — Z955 Presence of coronary angioplasty implant and graft: Secondary | ICD-10-CM | POA: Diagnosis not present

## 2021-01-15 DIAGNOSIS — I2510 Atherosclerotic heart disease of native coronary artery without angina pectoris: Secondary | ICD-10-CM | POA: Diagnosis not present

## 2021-01-15 DIAGNOSIS — Z79899 Other long term (current) drug therapy: Secondary | ICD-10-CM | POA: Diagnosis not present

## 2021-01-15 DIAGNOSIS — Z87891 Personal history of nicotine dependence: Secondary | ICD-10-CM | POA: Diagnosis not present

## 2021-01-15 DIAGNOSIS — I252 Old myocardial infarction: Secondary | ICD-10-CM | POA: Insufficient documentation

## 2021-01-15 LAB — BASIC METABOLIC PANEL
Anion gap: 8 (ref 5–15)
BUN: 9 mg/dL (ref 6–20)
CO2: 26 mmol/L (ref 22–32)
Calcium: 9 mg/dL (ref 8.9–10.3)
Chloride: 105 mmol/L (ref 98–111)
Creatinine, Ser: 1.09 mg/dL (ref 0.61–1.24)
GFR, Estimated: >60 mL/min (ref >60)
Glucose, Bld: 109 mg/dL — ABNORMAL HIGH (ref 70–99)
Potassium: 4.5 mmol/L (ref 3.5–5.1)
Sodium: 139 mmol/L (ref 135–145)

## 2021-01-15 MED ORDER — SACUBITRIL-VALSARTAN 24-26 MG PO TABS: 1.0000 | ORAL_TABLET | Freq: Two times a day (BID) | ORAL | 3 refills | Status: DC

## 2021-01-15 NOTE — Progress Notes (Signed)
Patient ID: Timothy Schultz, male    DOB: 06-Jun-1961, 60 y.o.   MRN: [id number]  HPI  [name] is a 60 y/o male with a history of CAD (MI/ stent), recent tobacco use and chronic heart failure.   Echo report from earlier today (01/15/21) reviewed and showed an EF of 30-35%. Echo report from 11/08/20 reviewed and showed an EF of 30-35%.  LHC completed 11/07/20 and showed:  Prox LAD to Mid LAD lesion is 100% stenosed.  2nd Diag lesion is 70% stenosed.  1st Mrg lesion is 75% stenosed.  Prox Cx to Mid Cx lesion is 60% stenosed.  A drug-eluting stent was successfully placed using a STENT SYNERGY DES 3X28.  Post intervention, there is a 0% residual stenosis.  Mid LAD-1 lesion is 70% stenosed.  Mid LAD-2 lesion is 40% stenosed.  A drug-eluting stent was successfully placed using a STENT SYNERGY DES 2.50X8.  Post intervention, there is a 0% residual stenosis.  Mid LM lesion is 30% stenosed.  Prox RCA lesion is 70% stenosed.   1.  Anterior ST elevation myocardial infarction 2.  Occluded proximal LAD 3.  Preserved left ventricular function 4.  Successful primary PCI with overlapping 3,0 x 28 mm and 2.5 x 8 mm Synergy stents proximal/mid LAD  Admitted 11/07/20 due to STEMI. Cardiology consult obtained & taken directly to cath lab. Found to have occluded proximal LAD and had placement of 2 overlapping stents. Received 3 shocks due to VT/VF along with amiodarone bolus followed by amiodarone drip. Lifevest was placed. Given IV antibiotics for pneumonia with transition to oral antibiotics. Discharged after 6 days.   He presents today for a follow-up visit with a chief complaint of minimal fatigue upon moderate exertion. He describes this as having been present for several months but he feels like he's continuing to improve. He has no other symptoms and specifically denies any difficulty sleeping, abdominal distention, palpitations, pedal edema, chest pain, dizziness, shortness of breath,  cough or weight gain.    Just saw cardiology today and he says that he had an echo completed. Discussion was had about AICD. Has tolerated spironolactone without known side effects.   Past Medical History:  Diagnosis Date  . CHF (congestive heart failure) (HCC)   . Coronary artery disease   . Tobacco use    Past Surgical History:  Procedure Laterality Date  . CORONARY/GRAFT ACUTE MI REVASCULARIZATION N/A 11/07/2020   Procedure: Coronary/Graft Acute MI Revascularization;  Surgeon: Marcina Millard, MD;  Location: [facility] INV[facility];  Service: Cardiovascular;  Laterality: N/A;  . LEFT HEART CATH AND CORONARY ANGIOGRAPHY N/A 11/07/2020   Procedure: LEFT HEART CATH AND CORONARY ANGIOGRAPHY;  Surgeon: Marcina Millard, MD;  Location: [facility] IN[facility];  Service: Cardiovascular;  Laterality: N/A;   No family history on file. Social History   Tobacco Use  . Smoking status: Former Smoker    Types: Cigarettes    Quit date: 11/29/2020    Years since quitting: 0.1  . Smokeless tobacco: Never Used  Substance Use Topics  . Alcohol use: Not Currently   No Known Allergies  Prior to Admission medications   Medication Sig Start Date End Date Taking? Authorizing Provider  buprenorphine (SUBUTEX) 8 MG SUBL SL tablet Place 8 mg under the tongue daily. 10/21/20  Yes [provider]  spironolactone (ALDACTONE) 25 MG tablet Take 1 tablet (25 mg total) by mouth daily. 12/30/20 03/30/21 Yes Hackney, Inetta Fermo A, FNP  atorvastatin (LIPITOR) 80 MG tablet Take 1  tablet (80 mg total) by mouth daily. 11/13/20 12/13/20  Charise Killian, MD  lisinopril (ZESTRIL) 2.5 MG tablet Take 1 tablet (2.5 mg total) by mouth daily. 11/13/20 12/13/20  Charise Killian, MD  metoprolol succinate (TOPROL-XL) 25 MG 24 hr tablet Take 0.5 tablets (12.5 mg total) by mouth daily. 11/13/20 12/13/20  Charise Killian, MD    Review of Systems  Constitutional: Positive for fatigue. Negative for appetite change.   HENT: Negative for congestion, postnasal drip and sore throat.   Eyes: Negative.   Respiratory: Negative for cough and shortness of breath.   Cardiovascular: Negative for chest pain, palpitations and leg swelling.  Gastrointestinal: Negative for abdominal distention and abdominal pain.  Endocrine: Negative.   Genitourinary: Negative.   Musculoskeletal: Negative for back pain and neck pain.  Skin: Negative.   Allergic/Immunologic: Negative.   Neurological: Negative for dizziness and light-headedness.  Hematological: Negative for adenopathy. Does not bruise/bleed easily.  Psychiatric/Behavioral: Negative for dysphoric mood and sleep disturbance (sleeping on 1 pillow). The patient is not nervous/anxious.    Vitals:   01/15/21 1233  BP: 112/77  Pulse: 61  Resp: 18  SpO2: 99%  Weight: 165 lb 4 oz (75 kg)  Height: 5\' 11"  (1.803 m)   Wt Readings from Last 3 Encounters:  01/15/21 165 lb 4 oz (75 kg)  12/30/20 165 lb 2 oz (74.9 kg)  11/18/20 166 lb 4 oz (75.4 kg)   Lab Results  Component Value Date   CREATININE 1.04 11/13/2020   CREATININE 1.22 11/11/2020   CREATININE 1.15 11/09/2020    Physical Exam Vitals and nursing note reviewed.  Constitutional:      Appearance: Normal appearance.  HENT:     Head: Normocephalic and atraumatic.  Cardiovascular:     Rate and Rhythm: Normal rate and regular rhythm.  Pulmonary:     Effort: Pulmonary effort is normal. No respiratory distress.     Breath sounds: No wheezing or rales.  Abdominal:     General: Abdomen is flat. There is no distension.     Palpations: Abdomen is soft.  Musculoskeletal:        General: No tenderness.     Cervical back: Normal range of motion and neck supple.     Right lower leg: No edema.     Left lower leg: No edema.  Skin:    General: Skin is warm and dry.  Neurological:     General: No focal deficit present.     Mental Status: He is alert and oriented to person, place, and time.  Psychiatric:         Mood and Affect: Mood normal.        Behavior: Behavior normal.        Thought Content: Thought content normal.    Assessment & Plan:  1: Chronic heart failure with reduced ejection fraction- - NYHA class II - euvolemic today - weighing daily; reminded to call for an overnight weight gain of > 2 pounds or a weekly weight gain of >5 pounds - weight stable from last visit 2 weeks ago - not adding salt and is using Mrs. Dash for seasoning; has been reading food labels for sodium content; reviewed the importance of keeping daily sodium intake to ~ 2000mg  / day.  - saw cardiology ([facility]aschos) earlier today (01/15/21) & had echo done - spironolactone 25mg  daily added at last visit - will check BMP today - patient to finish taking his lisinopril (has ~ 10 days left),  skip 1 day and then begin entresto 24/26mg  BID; 30 day entresto voucher given to patient - will check BMP next visit  - discussed titrating metoprolol (depending on HR) or adding SGLT2 at next visit - currently not wearing lifevest; says it became too aggravating to put it on and keep it on; discussion with cardiology about AICD - does not get the flu vaccine  2: CAD- - had 2 stents placed December 2021 - BMP 11/13/20 reviewed and showed sodium 136, potassium 3.4, creatinine 1.04 and GFR >60  3: Tobacco use- - says that he hasn't smoked any since his recent admission - congratulated on that and continued cessation encouraged   Medication bottles reviewed.   Return in 1 month or sooner for any questions/problems before then.

## 2021-01-15 NOTE — Patient Instructions (Addendum)
Continue weighing daily and call for an overnight weight gain of > 2 pounds or a weekly weight gain of >5 pounds.   Finish your lisinopril. Once you finish it, you will skip 1 day and then begin entresto as 1 tablet two times a day

## 2021-01-29 ENCOUNTER — Encounter: Payer: Self-pay | Admitting: Family

## 2021-02-09 NOTE — Progress Notes (Signed)
Patient ID: Timothy Schultz, male    DOB: 1961-05-03, 60 y.o.   MRN: [id number]  HPI  Mr Boody is a 60 y/o male with a history of CAD (MI/ stent), recent tobacco use and chronic heart failure.   Echo report from 01/15/21 reviewed and showed an EF of 30-35%. Echo report from 11/08/20 reviewed and showed an EF of 30-35%.  LHC completed 11/07/20 and showed:  Prox LAD to Mid LAD lesion is 100% stenosed.  2nd Diag lesion is 70% stenosed.  1st Mrg lesion is 75% stenosed.  Prox Cx to Mid Cx lesion is 60% stenosed.  A drug-eluting stent was successfully placed using a STENT SYNERGY DES 3X28.  Post intervention, there is a 0% residual stenosis.  Mid LAD-1 lesion is 70% stenosed.  Mid LAD-2 lesion is 40% stenosed.  A drug-eluting stent was successfully placed using a STENT SYNERGY DES 2.50X8.  Post intervention, there is a 0% residual stenosis.  Mid LM lesion is 30% stenosed.  Prox RCA lesion is 70% stenosed.   1.  Anterior ST elevation myocardial infarction 2.  Occluded proximal LAD 3.  Preserved left ventricular function 4.  Successful primary PCI with overlapping 3,0 x 28 mm and 2.5 x 8 mm Synergy stents proximal/mid LAD  Admitted 11/07/20 due to STEMI. Cardiology consult obtained & taken directly to cath lab. Found to have occluded proximal LAD and had placement of 2 overlapping stents. Received 3 shocks due to VT/VF along with amiodarone bolus followed by amiodarone drip. Lifevest was placed. Given IV antibiotics for pneumonia with transition to oral antibiotics. Discharged after 6 days.   He presents today for a follow-up visit with a chief complaint of minimal fatigue upon moderate exertion. He says that this has been present for several months although he does feel like it continues to improve. He has no other symptoms and specifically denies any difficulty sleeping, dizziness, abdominal distention, palpitations, pedal edema, chest pain, shortness of breath or cough.    Hasn't been weighing himself daily but does have scales at home.   Brings his medication bottles and admits that he's confused about his medications. He has both entresto and lisinopril and confirms that he's been taking both and he doesn't have spironolactone, plavix or aspirin and says that this is everything that he's taking.    Past Medical History:  Diagnosis Date  . CHF (congestive heart failure) (HCC)   . Coronary artery disease   . Tobacco use    Past Surgical History:  Procedure Laterality Date  . CORONARY/GRAFT ACUTE MI REVASCULARIZATION N/A 11/07/2020   Procedure: Coronary/Graft Acute MI Revascularization;  Surgeon: Marcina Millard, MD;  Location: [facility];  Service: Cardiovascular;  Laterality: N/A;  . LEFT HEART CATH AND CORONARY ANGIOGRAPHY N/A 11/07/2020   Procedure: LEFT HEART CATH AND CORONARY ANGIOGRAPHY;  Surgeon: Marcina Millard, MD;  Location: [facility];  Service: Cardiovascular;  Laterality: N/A;   No family history on file. Social History   Tobacco Use  . Smoking status: Former Smoker    Types: Cigarettes    Quit date: 11/29/2020    Years since quitting: 0.1  . Smokeless tobacco: Never Used  Substance Use Topics  . Alcohol use: Not Currently   No Known Allergies  Prior to Admission medications   Medication Sig Start Date End Date Taking? Authorizing Provider  atorvastatin (LIPITOR) 80 MG tablet Take 1 tablet (80 mg total) by mouth daily. 11/13/20 12/13/20 Yes Charise Killian, MD  buprenorphine (  SUBUTEX) 8 MG SUBL SL tablet Place 8 mg under the tongue daily. 10/21/20  Yes [provider]  lisinopril (ZESTRIL) 2.5 MG tablet Take 2.5 mg by mouth daily.   Yes [provider]  metoprolol succinate (TOPROL-XL) 25 MG 24 hr tablet Take 0.5 tablets (12.5 mg total) by mouth daily. Patient taking differently: Take 25 mg by mouth daily. 11/13/20 12/13/20 Yes Charise Killian, MD  sacubitril-valsartan  (ENTRESTO) 24-26 MG Take 1 tablet by mouth 2 (two) times daily. 01/15/21  Yes Clarisa Kindred A, FNP  spironolactone (ALDACTONE) 25 MG tablet Take 1 tablet (25 mg total) by mouth daily. Patient not taking: Reported on 02/10/2021 12/30/20 03/30/21  Delma Freeze, FNP     Review of Systems  Constitutional: Positive for fatigue. Negative for appetite change.  HENT: Negative for congestion, postnasal drip and sore throat.   Eyes: Negative.   Respiratory: Negative for cough and shortness of breath.   Cardiovascular: Negative for chest pain, palpitations and leg swelling.  Gastrointestinal: Negative for abdominal distention and abdominal pain.  Endocrine: Negative.   Genitourinary: Negative.   Musculoskeletal: Negative for back pain and neck pain.  Skin: Negative.   Allergic/Immunologic: Negative.   Neurological: Negative for dizziness and light-headedness.  Hematological: Negative for adenopathy. Does not bruise/bleed easily.  Psychiatric/Behavioral: Negative for dysphoric mood and sleep disturbance (sleeping on 1 pillow). The patient is not nervous/anxious.    Vitals:   02/10/21 1141  BP: 101/75  Pulse: 81  Resp: 20  SpO2: 99%  Weight: 170 lb (77.1 kg)  Height: 5\' 11"  (1.803 m)   Wt Readings from Last 3 Encounters:  02/10/21 170 lb (77.1 kg)  01/15/21 165 lb 4 oz (75 kg)  12/30/20 165 lb 2 oz (74.9 kg)   Lab Results  Component Value Date   CREATININE 1.09 01/15/2021   CREATININE 1.04 11/13/2020   CREATININE 1.22 11/11/2020    Physical Exam Vitals and nursing note reviewed.  Constitutional:      Appearance: Normal appearance.  HENT:     Head: Normocephalic and atraumatic.  Cardiovascular:     Rate and Rhythm: Normal rate and regular rhythm.  Pulmonary:     Effort: Pulmonary effort is normal. No respiratory distress.     Breath sounds: No wheezing or rales.  Abdominal:     General: Abdomen is flat. There is no distension.     Palpations: Abdomen is soft.  Musculoskeletal:         General: No tenderness.     Cervical back: Normal range of motion and neck supple.     Right lower leg: No edema.     Left lower leg: No edema.  Skin:    General: Skin is warm and dry.  Neurological:     General: No focal deficit present.     Mental Status: He is alert and oriented to person, place, and time.  Psychiatric:        Mood and Affect: Mood normal.        Behavior: Behavior normal.        Thought Content: Thought content normal.    Assessment & Plan:  1: Chronic heart failure with reduced ejection fraction- - NYHA class II - euvolemic today - not weighing daily; encouraged to resume so that he can call for an overnight weight gain of > 2 pounds or a weekly weight gain of >5 pounds - weight up 7 pounds from last visit 1 month ago - not adding salt and  is using Mrs. Dash for seasoning; has been reading food labels for sodium content; reviewed the importance of keeping daily sodium intake to ~ 2000mg  / day.  - saw cardiology (Paraschos) 01/15/21 - will check BMP today as entresto 24/25mg  was started at last visit although patient also continued to take lisinopril - lisinopril stopped today and patient gave me the unused lisinopril so that he wouldn't mistakenly take anymore; this was put in expired medication box for destroyal - he is also going to check with his pharmacy regarding spironolactone as he has refills on it although he's not been taking it for some reason; he admits to being quite confused about his medications - discussed titrating metoprolol (depending on HR) or adding SGLT2 at next visit but need to get his meds clear for him - currently not wearing lifevest; says it became too aggravating to put it on and keep it on; meeting with [facility] 02/17/21 about possible AICD - does not get the flu vaccine  2: CAD- - had 2 stents placed December 2021 - he doesn't have aspirin nor clopidogrel with him and he says that he brought everything he's taking - secure  chat sent to patient's cardiologist (Paraschos/ January 2022) who will f/u with patient and get him back on these medications - BMP 01/15/21 reviewed and showed sodium 139, potassium 4.5, creatinine 1.09 and GFR >60  3: Tobacco use- - says that he hasn't smoked any since his admission December 2021 - congratulated on that and continued cessation encouraged   Medication bottles reviewed.   Return in 1 month or sooner for any questions/problems before then.

## 2021-02-10 ENCOUNTER — Other Ambulatory Visit: Payer: Self-pay

## 2021-02-10 ENCOUNTER — Encounter: Payer: Self-pay | Admitting: Family

## 2021-02-10 ENCOUNTER — Ambulatory Visit: Payer: 59 | Admitting: Family

## 2021-02-10 ENCOUNTER — Other Ambulatory Visit
Admission: RE | Admit: 2021-02-10 | Discharge: 2021-02-10 | Disposition: A | Discharge status: Home / Self Care | Payer: 59 | Source: Ambulatory Visit | Attending: Family | Admitting: Family

## 2021-02-10 VITALS — BP 101/75 | HR 81 | Resp 20 | Ht 71.0 in | Wt 170.0 lb

## 2021-02-10 DIAGNOSIS — Z79899 Other long term (current) drug therapy: Secondary | ICD-10-CM | POA: Insufficient documentation

## 2021-02-10 DIAGNOSIS — Z955 Presence of coronary angioplasty implant and graft: Secondary | ICD-10-CM | POA: Insufficient documentation

## 2021-02-10 DIAGNOSIS — I2510 Atherosclerotic heart disease of native coronary artery without angina pectoris: Secondary | ICD-10-CM

## 2021-02-10 DIAGNOSIS — I5022 Chronic systolic (congestive) heart failure: Secondary | ICD-10-CM | POA: Insufficient documentation

## 2021-02-10 DIAGNOSIS — Z720 Tobacco use: Secondary | ICD-10-CM

## 2021-02-10 DIAGNOSIS — Z87891 Personal history of nicotine dependence: Secondary | ICD-10-CM | POA: Insufficient documentation

## 2021-02-10 DIAGNOSIS — I252 Old myocardial infarction: Secondary | ICD-10-CM | POA: Insufficient documentation

## 2021-02-10 LAB — BASIC METABOLIC PANEL
Anion gap: 8 (ref 5–15)
BUN: 9 mg/dL (ref 6–20)
CO2: 24 mmol/L (ref 22–32)
Calcium: 8.9 mg/dL (ref 8.9–10.3)
Chloride: 106 mmol/L (ref 98–111)
Creatinine, Ser: 1.29 mg/dL — ABNORMAL HIGH (ref 0.61–1.24)
GFR, Estimated: >60 mL/min (ref >60)
Glucose, Bld: 104 mg/dL — ABNORMAL HIGH (ref 70–99)
Potassium: 4.2 mmol/L (ref 3.5–5.1)
Sodium: 138 mmol/L (ref 135–145)

## 2021-02-10 NOTE — Patient Instructions (Addendum)
Begin weighing daily and call for an overnight weight gain of > 2 pounds or a weekly weight gain of >5 pounds. 

## 2021-02-17 ENCOUNTER — Telehealth: Payer: Self-pay | Admitting: Family

## 2021-02-17 NOTE — Telephone Encounter (Signed)
LVM with patient to notify him he was approved for patient assistance for entresto till the end of the year and advised patient to call and set up delivery monthy.  Amber, NT

## 2021-03-11 ENCOUNTER — Encounter: Payer: Self-pay | Admitting: Family

## 2021-03-11 ENCOUNTER — Ambulatory Visit: Payer: 59 | Attending: Family | Admitting: Family

## 2021-03-11 ENCOUNTER — Other Ambulatory Visit: Payer: Self-pay

## 2021-03-11 ENCOUNTER — Other Ambulatory Visit: Payer: Self-pay | Admitting: Family

## 2021-03-11 VITALS — BP 111/78 | HR 80 | Resp 20 | Ht 71.0 in | Wt 173.0 lb

## 2021-03-11 DIAGNOSIS — Z7902 Long term (current) use of antithrombotics/antiplatelets: Secondary | ICD-10-CM | POA: Insufficient documentation

## 2021-03-11 DIAGNOSIS — Z87891 Personal history of nicotine dependence: Secondary | ICD-10-CM | POA: Insufficient documentation

## 2021-03-11 DIAGNOSIS — I2510 Atherosclerotic heart disease of native coronary artery without angina pectoris: Secondary | ICD-10-CM | POA: Insufficient documentation

## 2021-03-11 DIAGNOSIS — Z79899 Other long term (current) drug therapy: Secondary | ICD-10-CM | POA: Diagnosis not present

## 2021-03-11 DIAGNOSIS — Z7982 Long term (current) use of aspirin: Secondary | ICD-10-CM | POA: Diagnosis not present

## 2021-03-11 DIAGNOSIS — Z720 Tobacco use: Secondary | ICD-10-CM

## 2021-03-11 DIAGNOSIS — Z955 Presence of coronary angioplasty implant and graft: Secondary | ICD-10-CM | POA: Diagnosis not present

## 2021-03-11 DIAGNOSIS — Z7901 Long term (current) use of anticoagulants: Secondary | ICD-10-CM | POA: Insufficient documentation

## 2021-03-11 DIAGNOSIS — I252 Old myocardial infarction: Secondary | ICD-10-CM | POA: Insufficient documentation

## 2021-03-11 DIAGNOSIS — I5022 Chronic systolic (congestive) heart failure: Secondary | ICD-10-CM | POA: Diagnosis not present

## 2021-03-11 LAB — BASIC METABOLIC PANEL
Anion gap: 8 (ref 5–15)
BUN: 12 mg/dL (ref 6–20)
CO2: 21 mmol/L — ABNORMAL LOW (ref 22–32)
Calcium: 8.7 mg/dL — ABNORMAL LOW (ref 8.9–10.3)
Chloride: 106 mmol/L (ref 98–111)
Creatinine, Ser: 1.35 mg/dL — ABNORMAL HIGH (ref 0.61–1.24)
GFR, Estimated: >60 mL/min (ref >60)
Glucose, Bld: 99 mg/dL (ref 70–99)
Potassium: 4.4 mmol/L (ref 3.5–5.1)
Sodium: 135 mmol/L (ref 135–145)

## 2021-03-11 MED ORDER — SPIRONOLACTONE 25 MG PO TABS: 25.0000 mg | ORAL_TABLET | Freq: Every day | ORAL | 5 refills | Status: DC

## 2021-03-11 NOTE — Patient Instructions (Addendum)
Resume weighing daily and call for an overnight weight gain of > 2 pounds or a weekly weight gain of >5 pounds. 

## 2021-03-11 NOTE — Progress Notes (Signed)
Patient ID: Timothy Schultz, male    DOB: [birth year], 60 y.o.   MRN: [id number]  HPI  Timothy Schultz is a 60 y/o male with a history of CAD (MI/ stent), recent tobacco use and chronic heart failure.   Echo report from 01/15/21 reviewed and showed an EF of 30-35%. Echo report from 11/08/20 reviewed and showed an EF of 30-35%.  LHC completed 11/07/20 and showed:  Prox LAD to Mid LAD lesion is 100% stenosed.  2nd Diag lesion is 70% stenosed.  1st Mrg lesion is 75% stenosed.  Prox Cx to Mid Cx lesion is 60% stenosed.  A drug-eluting stent was successfully placed using a STENT SYNERGY DES 3X28.  Post intervention, there is a 0% residual stenosis.  Mid LAD-1 lesion is 70% stenosed.  Mid LAD-2 lesion is 40% stenosed.  A drug-eluting stent was successfully placed using a STENT SYNERGY DES 2.50X8.  Post intervention, there is a 0% residual stenosis.  Mid LM lesion is 30% stenosed.  Prox RCA lesion is 70% stenosed.   1.  Anterior ST elevation myocardial infarction 2.  Occluded proximal LAD 3.  Preserved left ventricular function 4.  Successful primary PCI with overlapping 3,0 x 28 mm and 2.5 x 8 mm Synergy stents proximal/mid LAD  Admitted 11/07/20 due to STEMI. Cardiology consult obtained & taken directly to cath lab. Found to have occluded proximal LAD and had placement of 2 overlapping stents. Received 3 shocks due to VT/VF along with amiodarone bolus followed by amiodarone drip. Lifevest was placed. Given IV antibiotics for pneumonia with transition to oral antibiotics. Discharged after 6 days.   He presents today for a follow-up visit with a chief complaint of minimal fatigue upon moderate exertion. He describes this as having been present for several months although, overall, he says that he continues to feel well. He has no other symptoms and specifically denies any difficulty sleeping, dizziness, abdominal distention, palpitations, pedal edema, chest pain, shortness of breath,  cough or overnight weight gain.   He says that he's still had some confusion regarding his medications and thought he was taking something as 1/2 tablet daily (per mychart) but none of his medication labels say that.   Past Medical History:  Diagnosis Date  . CHF (congestive heart failure) (HCC)   . Coronary artery disease   . Tobacco use    Past Surgical History:  Procedure Laterality Date  . CORONARY/GRAFT ACUTE MI REVASCULARIZATION N/A 11/07/2020   Procedure: Coronary/Graft Acute MI Revascularization;  Surgeon: Marcina Millard, MD;  Location: [facility];  Service: Cardiovascular;  Laterality: N/A;  . LEFT HEART CATH AND CORONARY ANGIOGRAPHY N/A 11/07/2020   Procedure: LEFT HEART CATH AND CORONARY ANGIOGRAPHY;  Surgeon: Marcina Millard, MD;  Location: [facility];  Service: Cardiovascular;  Laterality: N/A;   No family history on file. Social History   Tobacco Use  . Smoking status: Former Smoker    Types: Cigarettes    Quit date: 11/29/2020    Years since quitting: 0.2  . Smokeless tobacco: Never Used  Substance Use Topics  . Alcohol use: Not Currently   No Known Allergies  Prior to Admission medications   Medication Sig Start Date End Date Taking? Authorizing Provider  aspirin EC 81 MG tablet Take 81 mg by mouth daily. Swallow whole.   Yes [provider]  atorvastatin (LIPITOR) 80 MG tablet Take 1 tablet (80 mg total) by mouth daily. 11/13/20 12/13/20 Yes Charise Killian, MD  buprenorphine (SUBUTEX)  8 MG SUBL SL tablet Place 8 mg under the tongue daily. 10/21/20  Yes [provider]  clopidogrel (PLAVIX) 75 MG tablet Take 75 mg by mouth daily.   Yes [provider]  metoprolol succinate (TOPROL-XL) 25 MG 24 hr tablet Take 12.5 mg by mouth daily.   Yes [provider]  sacubitril-valsartan (ENTRESTO) 24-26 MG Take 1 tablet by mouth 2 (two) times daily. 01/15/21  Yes Clarisa Kindred A, FNP  spironolactone  (ALDACTONE) 25 MG tablet Take 1 tablet (25 mg total) by mouth daily. 03/11/21 06/09/21  Delma Freeze, FNP    Review of Systems  Constitutional: Positive for fatigue. Negative for appetite change.  HENT: Negative for congestion, postnasal drip and sore throat.   Eyes: Negative.   Respiratory: Negative for cough and shortness of breath.   Cardiovascular: Negative for chest pain, palpitations and leg swelling.  Gastrointestinal: Negative for abdominal distention and abdominal pain.  Endocrine: Negative.   Genitourinary: Negative.   Musculoskeletal: Negative for back pain and neck pain.  Skin: Negative.   Allergic/Immunologic: Negative.   Neurological: Negative for dizziness and light-headedness.  Hematological: Negative for adenopathy. Does not bruise/bleed easily.  Psychiatric/Behavioral: Negative for dysphoric mood and sleep disturbance (sleeping on 1 pillow). The patient is not nervous/anxious.    Vitals:   03/11/21 1400  BP: 111/78  Pulse: 80  Resp: 20  SpO2: 99%  Weight: 173 lb (78.5 kg)  Height: 5\' 11"  (1.803 m)   Wt Readings from Last 3 Encounters:  03/11/21 173 lb (78.5 kg)  02/10/21 170 lb (77.1 kg)  01/15/21 165 lb 4 oz (75 kg)   Lab Results  Component Value Date   CREATININE 1.29 (H) 02/10/2021   CREATININE 1.09 01/15/2021   CREATININE 1.04 11/13/2020    Physical Exam Vitals and nursing note reviewed.  Constitutional:      Appearance: Normal appearance.  HENT:     Head: Normocephalic and atraumatic.  Cardiovascular:     Rate and Rhythm: Normal rate and regular rhythm.  Pulmonary:     Effort: Pulmonary effort is normal. No respiratory distress.     Breath sounds: No wheezing or rales.  Abdominal:     General: Abdomen is flat. There is no distension.     Palpations: Abdomen is soft.  Musculoskeletal:        General: No tenderness.     Cervical back: Normal range of motion and neck supple.     Right lower leg: No edema.     Left lower leg: No edema.   Skin:    General: Skin is warm and dry.  Neurological:     General: No focal deficit present.     Mental Status: He is alert and oriented to person, place, and time.  Psychiatric:        Mood and Affect: Mood normal.        Behavior: Behavior normal.        Thought Content: Thought content normal.    Assessment & Plan:  1: Chronic heart failure with reduced ejection fraction- - NYHA class II - euvolemic today - not weighing daily; encouraged to resume so that he can call for an overnight weight gain of > 2 pounds or a weekly weight gain of >5 pounds - weight up 3 pounds from last visit 1 month ago - not adding salt and is using Mrs. Dash for seasoning; has been reading food labels for sodium content; reviewed the importance of keeping daily sodium intake  to ~ 2000mg  / day.  - saw cardiology (Paraschos) 01/15/21 - will re-check BMP today since he's on entresto/ spironolactone - continues to have some confusion about his metoprolol (this time). Says that he's sometimes taken it as 1/2 tablet daily (per mychart) and then others as 1 tablet daily (per label). Advised him to take 1 whole tablet and this was changed within Epic so it will now match the labels on his bottles - discussed titrating metoprolol (depending on HR) or adding SGLT2 at next visit but need to get where he's not confused about current regimen first - currently not wearing lifevest; says it became too aggravating to put it on and keep it on; meeting with [facility] 03/20/21 about possible AICD - does not get the flu vaccine  2: CAD- - had 2 stents placed December 2021 - now back on his clopidogrel  - BMP 02/10/21 reviewed and showed sodium 138, potassium 4.2, creatinine 1.29 and GFR >60  3: Tobacco use- - says that he hasn't smoked any since his admission December 2021 - congratulated on that and continued cessation encouraged   Medication bottles reviewed.   Return in 1 month or sooner for any questions/problems  before then.

## 2021-04-06 ENCOUNTER — Encounter: Payer: Self-pay | Admitting: Family

## 2021-04-07 ENCOUNTER — Ambulatory Visit: Payer: 59 | Admitting: Family

## 2021-04-07 ENCOUNTER — Encounter: Payer: Self-pay | Admitting: Family

## 2021-04-07 ENCOUNTER — Other Ambulatory Visit: Payer: Self-pay

## 2021-04-07 ENCOUNTER — Ambulatory Visit: Payer: 59 | Attending: Family | Admitting: Family

## 2021-04-07 VITALS — BP 116/82 | HR 57 | Resp 20 | Ht 71.0 in | Wt 186.4 lb

## 2021-04-07 DIAGNOSIS — Z7982 Long term (current) use of aspirin: Secondary | ICD-10-CM | POA: Insufficient documentation

## 2021-04-07 DIAGNOSIS — I509 Heart failure, unspecified: Secondary | ICD-10-CM | POA: Diagnosis present

## 2021-04-07 DIAGNOSIS — I5022 Chronic systolic (congestive) heart failure: Secondary | ICD-10-CM

## 2021-04-07 DIAGNOSIS — I2510 Atherosclerotic heart disease of native coronary artery without angina pectoris: Secondary | ICD-10-CM | POA: Insufficient documentation

## 2021-04-07 DIAGNOSIS — Z79899 Other long term (current) drug therapy: Secondary | ICD-10-CM | POA: Diagnosis not present

## 2021-04-07 DIAGNOSIS — I252 Old myocardial infarction: Secondary | ICD-10-CM | POA: Insufficient documentation

## 2021-04-07 DIAGNOSIS — Z87891 Personal history of nicotine dependence: Secondary | ICD-10-CM | POA: Diagnosis not present

## 2021-04-07 DIAGNOSIS — Z720 Tobacco use: Secondary | ICD-10-CM

## 2021-04-07 DIAGNOSIS — Z955 Presence of coronary angioplasty implant and graft: Secondary | ICD-10-CM | POA: Diagnosis not present

## 2021-04-07 NOTE — Patient Instructions (Signed)
Continue weighing daily and call for an overnight weight gain of > 2 pounds or a weekly weight gain of >5 pounds. 

## 2021-04-07 NOTE — Progress Notes (Signed)
Patient ID: Timothy Schultz, male    DOB: [birth year], 60 y.o.   MRN: [id number]   Mr Berhane is a 60 y/o male with a history of CAD (MI/ stent), recent tobacco use and chronic heart failure.   Echo report from 01/15/21 reviewed and showed an EF of 30-35%. Echo report from 11/08/20 reviewed and showed an EF of 30-35%.  LHC completed 11/07/20 and showed:  Prox LAD to Mid LAD lesion is 100% stenosed.  2nd Diag lesion is 70% stenosed.  1st Mrg lesion is 75% stenosed.  Prox Cx to Mid Cx lesion is 60% stenosed.  A drug-eluting stent was successfully placed using a STENT SYNERGY DES 3X28.  Post intervention, there is a 0% residual stenosis.  Mid LAD-1 lesion is 70% stenosed.  Mid LAD-2 lesion is 40% stenosed.  A drug-eluting stent was successfully placed using a STENT SYNERGY DES 2.50X8.  Post intervention, there is a 0% residual stenosis.  Mid LM lesion is 30% stenosed.  Prox RCA lesion is 70% stenosed.   1.  Anterior ST elevation myocardial infarction 2.  Occluded proximal LAD 3.  Preserved left ventricular function 4.  Successful primary PCI with overlapping 3,0 x 28 mm and 2.5 x 8 mm Synergy stents proximal/mid LAD  Admitted 11/07/20 due to STEMI. Cardiology consult obtained & taken directly to cath lab. Found to have occluded proximal LAD and had placement of 2 overlapping stents. Received 3 shocks due to VT/VF along with amiodarone bolus followed by amiodarone drip. Lifevest was placed. Given IV antibiotics for pneumonia with transition to oral antibiotics. Discharged after 6 days.   He presents today for a follow-up visit with a chief complaint of minimal fatigue upon moderate exertion. He describes this as chronic in nature having been present for several months. He has associated gradual weight gain along with this. He attributes his weight gain due to tobacco stoppage. He denies any difficulty sleeping, abdominal distention, palpitations, pedal edema, chest pain, cough or  dizziness.   Says that he is scheduled to have AICD implanted in a few weeks.   Past Medical History:  Diagnosis Date  . CHF (congestive heart failure) (HCC)   . Coronary artery disease   . Tobacco use    Past Surgical History:  Procedure Laterality Date  . CORONARY/GRAFT ACUTE MI REVASCULARIZATION N/A 11/07/2020   Procedure: Coronary/Graft Acute MI Revascularization;  Surgeon: Marcina Millard, MD;  Location: [facility];  Service: Cardiovascular;  Laterality: N/A;  . LEFT HEART CATH AND CORONARY ANGIOGRAPHY N/A 11/07/2020   Procedure: LEFT HEART CATH AND CORONARY ANGIOGRAPHY;  Surgeon: Marcina Millard, MD;  Location: [facility];  Service: Cardiovascular;  Laterality: N/A;   No family history on file. Social History   Tobacco Use  . Smoking status: Former Smoker    Types: Cigarettes    Quit date: 11/29/2020    Years since quitting: 0.3  . Smokeless tobacco: Never Used  Substance Use Topics  . Alcohol use: Not Currently   No Known Allergies  Prior to Admission medications   Medication Sig Start Date End Date Taking? Authorizing Provider  aspirin EC 81 MG tablet Take 81 mg by mouth daily. Swallow whole.   Yes [provider]  buprenorphine (SUBUTEX) 8 MG SUBL SL tablet Place 8 mg under the tongue daily. 10/21/20  Yes [provider]  clopidogrel (PLAVIX) 75 MG tablet Take 75 mg by mouth daily.   Yes [provider]  metoprolol succinate (TOPROL-XL) 25 MG  24 hr tablet Take 25 mg by mouth daily.   Yes [provider]  sacubitril-valsartan (ENTRESTO) 24-26 MG Take 1 tablet by mouth 2 (two) times daily. 01/15/21  Yes Clarisa Kindred A, FNP  spironolactone (ALDACTONE) 25 MG tablet TAKE 1 TABLET(25 MG) BY MOUTH DAILY 03/12/21  Yes Clarisa Kindred A, FNP  atorvastatin (LIPITOR) 80 MG tablet Take 1 tablet (80 mg total) by mouth daily. 11/13/20 12/13/20  Charise Killian, MD   Review of Systems  Constitutional: Positive for  fatigue. Negative for appetite change.  HENT: Negative for congestion, postnasal drip and sore throat.   Eyes: Negative.   Respiratory: Negative for cough and shortness of breath.   Cardiovascular: Negative for chest pain, palpitations and leg swelling.  Gastrointestinal: Negative for abdominal distention and abdominal pain.  Endocrine: Negative.   Genitourinary: Negative.   Musculoskeletal: Negative for back pain and neck pain.  Skin: Negative.   Allergic/Immunologic: Negative.   Neurological: Negative for dizziness and light-headedness.  Hematological: Negative for adenopathy. Does not bruise/bleed easily.  Psychiatric/Behavioral: Negative for dysphoric mood and sleep disturbance (sleeping on 1 pillow). The patient is not nervous/anxious.    Vitals:   04/07/21 1354  BP: 116/82  Pulse: (!) 57  Resp: 20  SpO2: 97%  Weight: 186 lb 6 oz (84.5 kg)  Height: 5\' 11"  (1.803 m)   Wt Readings from Last 3 Encounters:  04/07/21 186 lb 6 oz (84.5 kg)  03/11/21 173 lb (78.5 kg)  02/10/21 170 lb (77.1 kg)   Lab Results  Component Value Date   CREATININE 1.35 (H) 03/11/2021   CREATININE 1.29 (H) 02/10/2021   CREATININE 1.09 01/15/2021   Physical Exam Vitals and nursing note reviewed.  Constitutional:      Appearance: Normal appearance.  HENT:     Head: Normocephalic and atraumatic.  Cardiovascular:     Rate and Rhythm: Regular rhythm. Bradycardia present.  Pulmonary:     Effort: Pulmonary effort is normal. No respiratory distress.     Breath sounds: No wheezing or rales.  Abdominal:     General: Abdomen is flat. There is no distension.     Palpations: Abdomen is soft.  Musculoskeletal:        General: No tenderness.     Cervical back: Normal range of motion and neck supple.     Right lower leg: No edema.     Left lower leg: No edema.  Skin:    General: Skin is warm and dry.  Neurological:     General: No focal deficit present.     Mental Status: He is alert and oriented to  person, place, and time.  Psychiatric:        Mood and Affect: Mood normal.        Behavior: Behavior normal.        Thought Content: Thought content normal.    Assessment & Plan:  1: Chronic heart failure with reduced ejection fraction- - NYHA class II - euvolemic today - not weighing daily; encouraged to resume so that he can call for an overnight weight gain of > 2 pounds or a weekly weight gain of >5 pounds - weight up 13 pounds from last visit 1 month ago - not adding salt and is using Mrs. Dash for seasoning; has been reading food labels for sodium content; reviewed the importance of keeping daily sodium intake to ~ 2000mg  / day.  - says that he's been eating too much since stopping smoking; discussed other alternatives besides  eating or making healthier choices to snack on - saw cardiology (Paraschos) 01/15/21 - on GDMT of metoprolol, entresto and spironolactone - bradycardic so unable to titrate metoprolol and BP may not allow for entresto titration - consider SGLT2 after his AICD implanted - saw EP Wilford Grist) 03/24/21 - does not get the flu vaccine  2: CAD- - had 2 stents placed December 2021 - BMP 02/10/21 reviewed and showed sodium 138, potassium 4.2, creatinine 1.29 and GFR >60  3: Tobacco use- - says that he hasn't smoked any since his admission December 2021 - congratulated on that and continued cessation encouraged   Medication bottles reviewed.   Return in 6 weeks or sooner for any questions/problems before then.

## 2021-05-20 ENCOUNTER — Encounter: Payer: Self-pay | Admitting: Family

## 2021-05-23 NOTE — Progress Notes (Deleted)
Patient ID: Timothy Schultz, male    DOB: [birth year], 60 y.o.   MRN: [id number]   Timothy Schultz is a 60 y/o male with a history of CAD (MI/ stent), recent tobacco use and chronic heart failure.   Echo report from 01/15/21 reviewed and showed an EF of 30-35%. Echo report from 11/08/20 reviewed and showed an EF of 30-35%.  LHC completed 11/07/20 and showed: Prox LAD to Mid LAD lesion is 100% stenosed. 2nd Diag lesion is 70% stenosed. 1st Mrg lesion is 75% stenosed. Prox Cx to Mid Cx lesion is 60% stenosed. A drug-eluting stent was successfully placed using a STENT SYNERGY DES 3X28. Post intervention, there is a 0% residual stenosis. Mid LAD-1 lesion is 70% stenosed. Mid LAD-2 lesion is 40% stenosed. A drug-eluting stent was successfully placed using a STENT SYNERGY DES 2.50X8. Post intervention, there is a 0% residual stenosis. Mid LM lesion is 30% stenosed. Prox RCA lesion is 70% stenosed.   1.  Anterior ST elevation myocardial infarction 2.  Occluded proximal LAD 3.  Preserved left ventricular function 4.  Successful primary PCI with overlapping 3,0 x 28 mm and 2.5 x 8 mm Synergy stents proximal/mid LAD  Admitted 11/07/20 due to STEMI. Cardiology consult obtained & taken directly to cath lab. Found to have occluded proximal LAD and had placement of 2 overlapping stents. Received 3 shocks due to VT/VF along with amiodarone bolus followed by amiodarone drip. Lifevest was placed. Given IV antibiotics for pneumonia with transition to oral antibiotics. Discharged after 6 days.   He presents today for a follow-up visit with a chief complaint of   Past Medical History:  Diagnosis Date   CHF (congestive heart failure) (HCC)    Coronary artery disease    Tobacco use    Past Surgical History:  Procedure Laterality Date   CORONARY/GRAFT ACUTE MI REVASCULARIZATION N/A 11/07/2020   Procedure: Coronary/Graft Acute MI Revascularization;  Surgeon: Marcina Millard, MD;  Location: [facility]  INVASIVE CV LAB;  Service: Cardiovascular;  Laterality: N/A;   LEFT HEART CATH AND CORONARY ANGIOGRAPHY N/A 11/07/2020   Procedure: LEFT HEART CATH AND CORONARY ANGIOGRAPHY;  Surgeon: Marcina Millard, MD;  Location: [facility] INVASIVE CV LAB;  Service: Cardiovascular;  Laterality: N/A;   No family history on file. Social History   Tobacco Use   Smoking status: Former    Pack years: 0.00    Types: Cigarettes    Quit date: 11/29/2020    Years since quitting: 0.4   Smokeless tobacco: Never  Substance Use Topics   Alcohol use: Not Currently   No Known Allergies   Review of Systems  Constitutional:  Positive for fatigue. Negative for appetite change.  HENT:  Negative for congestion, postnasal drip and sore throat.   Eyes: Negative.   Respiratory:  Negative for cough and shortness of breath.   Cardiovascular:  Negative for chest pain, palpitations and leg swelling.  Gastrointestinal:  Negative for abdominal distention and abdominal pain.  Endocrine: Negative.   Genitourinary: Negative.   Musculoskeletal:  Negative for back pain and neck pain.  Skin: Negative.   Allergic/Immunologic: Negative.   Neurological:  Negative for dizziness and light-headedness.  Hematological:  Negative for adenopathy. Does not bruise/bleed easily.  Psychiatric/Behavioral:  Negative for dysphoric mood and sleep disturbance (sleeping on 1 pillow). The patient is not nervous/anxious.     Physical Exam Vitals and nursing note reviewed.  Constitutional:      Appearance: Normal appearance.  HENT:     Head: Normocephalic  and atraumatic.  Cardiovascular:     Rate and Rhythm: Regular rhythm. Bradycardia present.  Pulmonary:     Effort: Pulmonary effort is normal. No respiratory distress.     Breath sounds: No wheezing or rales.  Abdominal:     General: Abdomen is flat. There is no distension.     Palpations: Abdomen is soft.  Musculoskeletal:        General: No tenderness.     Cervical back: Normal range  of motion and neck supple.     Right lower leg: No edema.     Left lower leg: No edema.  Skin:    General: Skin is warm and dry.  Neurological:     General: No focal deficit present.     Mental Status: He is alert and oriented to person, place, and time.  Psychiatric:        Mood and Affect: Mood normal.        Behavior: Behavior normal.        Thought Content: Thought content normal.   Assessment & Plan:  1: Chronic heart failure with reduced ejection fraction- - NYHA class II - euvolemic today - not weighing daily; encouraged to resume so that he can call for an overnight weight gain of > 2 pounds or a weekly weight gain of >5 pounds - weight 186.6 pounds from last visit 6 weeks ago - not adding salt and is using Mrs. Dash for seasoning; has been reading food labels for sodium content; reviewed the importance of keeping daily sodium intake to ~ 2000mg  / day.  - says that he's been eating too much since stopping smoking; discussed other alternatives besides eating or making healthier choices to snack on - saw cardiology ([facility]as[facility]) 04/22/21 - on GDMT of metoprolol, entresto and spironolactone - bradycardic so unable to titrate metoprolol and BP may not allow for entresto titration - consider SGLT2 after his AICD implanted - saw EP 04/24/21) 03/24/21 - AICD implanted 04/16/21  2: CAD- - had 2 stents placed December 2021 - BMP 04/16/21 reviewed and showed sodium 137, potassium 4.3, creatinine 1.3 and GFR 64  3: Tobacco use- - says that he hasn't smoked any since his admission December 2021 - congratulated on that and continued cessation encouraged   Medication bottles reviewed.

## 2021-05-25 ENCOUNTER — Ambulatory Visit: Payer: 59 | Admitting: Family

## 2021-06-03 ENCOUNTER — Other Ambulatory Visit: Payer: Self-pay

## 2021-06-03 ENCOUNTER — Ambulatory Visit: Payer: 59 | Attending: Family | Admitting: Family

## 2021-06-03 ENCOUNTER — Encounter: Payer: Self-pay | Admitting: Family

## 2021-06-03 VITALS — BP 97/72 | HR 78 | Resp 14 | Ht 70.0 in | Wt 189.1 lb

## 2021-06-03 DIAGNOSIS — Z955 Presence of coronary angioplasty implant and graft: Secondary | ICD-10-CM | POA: Insufficient documentation

## 2021-06-03 DIAGNOSIS — Z95810 Presence of automatic (implantable) cardiac defibrillator: Secondary | ICD-10-CM | POA: Diagnosis not present

## 2021-06-03 DIAGNOSIS — Z79899 Other long term (current) drug therapy: Secondary | ICD-10-CM | POA: Insufficient documentation

## 2021-06-03 DIAGNOSIS — I5022 Chronic systolic (congestive) heart failure: Secondary | ICD-10-CM | POA: Diagnosis not present

## 2021-06-03 DIAGNOSIS — Z87891 Personal history of nicotine dependence: Secondary | ICD-10-CM | POA: Insufficient documentation

## 2021-06-03 DIAGNOSIS — I252 Old myocardial infarction: Secondary | ICD-10-CM | POA: Insufficient documentation

## 2021-06-03 DIAGNOSIS — Z7982 Long term (current) use of aspirin: Secondary | ICD-10-CM | POA: Insufficient documentation

## 2021-06-03 DIAGNOSIS — Z720 Tobacco use: Secondary | ICD-10-CM

## 2021-06-03 DIAGNOSIS — I2510 Atherosclerotic heart disease of native coronary artery without angina pectoris: Secondary | ICD-10-CM | POA: Diagnosis not present

## 2021-06-03 NOTE — Patient Instructions (Signed)
Continue weighing daily and call for an overnight weight gain of > 2 pounds or a weekly weight gain of >5 pounds. 

## 2021-06-03 NOTE — Progress Notes (Signed)
Patient ID: Timothy Schultz, male    DOB: [birth year], 60 y.o.   MRN: [id number]   Mr Grupe is a 60 y/o male with a history of CAD (MI/ stent), recent tobacco use and chronic heart failure.   Echo report from 01/15/21 reviewed and showed an EF of 30-35%. Echo report from 11/08/20 reviewed and showed an EF of 30-35%.  LHC completed 11/07/20 and showed: Prox LAD to Mid LAD lesion is 100% stenosed. 2nd Diag lesion is 70% stenosed. 1st Mrg lesion is 75% stenosed. Prox Cx to Mid Cx lesion is 60% stenosed. A drug-eluting stent was successfully placed using a STENT SYNERGY DES 3X28. Post intervention, there is a 0% residual stenosis. Mid LAD-1 lesion is 70% stenosed. Mid LAD-2 lesion is 40% stenosed. A drug-eluting stent was successfully placed using a STENT SYNERGY DES 2.50X8. Post intervention, there is a 0% residual stenosis. Mid LM lesion is 30% stenosed. Prox RCA lesion is 70% stenosed.   1.  Anterior ST elevation myocardial infarction 2.  Occluded proximal LAD 3.  Preserved left ventricular function 4.  Successful primary PCI with overlapping 3,0 x 28 mm and 2.5 x 8 mm Synergy stents proximal/mid LAD  Admitted 11/07/20 due to STEMI. Cardiology consult obtained & taken directly to cath lab. Found to have occluded proximal LAD and had placement of 2 overlapping stents. Received 3 shocks due to VT/VF along with amiodarone bolus followed by amiodarone drip. Lifevest was placed. Given IV antibiotics for pneumonia with transition to oral antibiotics. Discharged after 6 days.   He presents today for a follow-up visit with a chief complaint of minimal fatigue upon moderate exertion. He describes this as chronic in nature having been present for several months. He has associated easy bruising and continued weight gain along with this. He denies any abdominal distention, palpitations, pedal edema, chest pain, shortness of breath, cough or dizziness.   No shocking from his AICD.   Past Medical  History:  Diagnosis Date   CHF (congestive heart failure) (HCC)    Coronary artery disease    Tobacco use    Past Surgical History:  Procedure Laterality Date   CORONARY/GRAFT ACUTE MI REVASCULARIZATION N/A 11/07/2020   Procedure: Coronary/Graft Acute MI Revascularization;  Surgeon: Marcina Millard, MD;  Location: [facility] INVASIVE CV LAB;  Service: Cardiovascular;  Laterality: N/A;   LEFT HEART CATH AND CORONARY ANGIOGRAPHY N/A 11/07/2020   Procedure: LEFT HEART CATH AND CORONARY ANGIOGRAPHY;  Surgeon: Marcina Millard, MD;  Location: [facility] INVASIVE CV LAB;  Service: Cardiovascular;  Laterality: N/A;   No family history on file. Social History   Tobacco Use   Smoking status: Former    Pack years: 0.00    Types: Cigarettes    Quit date: 11/29/2020    Years since quitting: 0.5   Smokeless tobacco: Never  Substance Use Topics   Alcohol use: Not Currently   No Known Allergies  Prior to Admission medications   Medication Sig Start Date End Date Taking? Authorizing Provider  aspirin EC 81 MG tablet Take 81 mg by mouth daily. Swallow whole.   Yes [provider]  atorvastatin (LIPITOR) 80 MG tablet Take 1 tablet (80 mg total) by mouth daily. 11/13/20 05/04/22 Yes Charise Killian, MD  buprenorphine (SUBUTEX) 8 MG SUBL SL tablet Place 8 mg under the tongue daily. 10/21/20  Yes [provider]  clopidogrel (PLAVIX) 75 MG tablet Take 75 mg by mouth daily.   Yes [provider]  metoprolol succinate (TOPROL-XL) 25  MG 24 hr tablet Take 25 mg by mouth daily.   Yes [provider]  sacubitril-valsartan (ENTRESTO) 24-26 MG Take 1 tablet by mouth 2 (two) times daily. 01/15/21  Yes Clarisa Kindred A, FNP  spironolactone (ALDACTONE) 25 MG tablet TAKE 1 TABLET(25 MG) BY MOUTH DAILY 03/12/21  Yes Delma Freeze, FNP     Review of Systems  Constitutional:  Positive for fatigue. Negative for appetite change.  HENT:  Negative for congestion, postnasal drip and  sore throat.   Eyes: Negative.   Respiratory:  Negative for cough and shortness of breath.   Cardiovascular:  Negative for chest pain, palpitations and leg swelling.  Gastrointestinal:  Negative for abdominal distention and abdominal pain.  Endocrine: Negative.   Genitourinary: Negative.   Musculoskeletal:  Negative for back pain and neck pain.  Skin: Negative.   Allergic/Immunologic: Negative.   Neurological:  Negative for dizziness and light-headedness.  Hematological:  Negative for adenopathy. Bruises/bleeds easily.  Psychiatric/Behavioral:  Negative for dysphoric mood and sleep disturbance (sleeping on 1 pillow). The patient is not nervous/anxious.    Vitals:   06/03/21 1336  BP: 97/72  Pulse: 78  Resp: 14  SpO2: 99%  Weight: 189 lb 2 oz (85.8 kg)  Height: 5\' 10"  (1.778 m)   Wt Readings from Last 3 Encounters:  06/03/21 189 lb 2 oz (85.8 kg)  04/07/21 186 lb 6 oz (84.5 kg)  03/11/21 173 lb (78.5 kg)   Lab Results  Component Value Date   CREATININE 1.35 (H) 03/11/2021   CREATININE 1.29 (H) 02/10/2021   CREATININE 1.09 01/15/2021    Physical Exam Vitals and nursing note reviewed.  Constitutional:      Appearance: Normal appearance.  HENT:     Head: Normocephalic and atraumatic.  Cardiovascular:     Rate and Rhythm: Normal rate and regular rhythm.  Pulmonary:     Effort: Pulmonary effort is normal. No respiratory distress.     Breath sounds: No wheezing or rales.  Abdominal:     General: Abdomen is flat. There is no distension.     Palpations: Abdomen is soft.  Musculoskeletal:        General: No tenderness.     Cervical back: Normal range of motion and neck supple.     Right lower leg: No edema.     Left lower leg: No edema.  Skin:    General: Skin is warm and dry.  Neurological:     General: No focal deficit present.     Mental Status: He is alert and oriented to person, place, and time.  Psychiatric:        Mood and Affect: Mood normal.         Behavior: Behavior normal.        Thought Content: Thought content normal.   Assessment & Plan:  1: Chronic heart failure with reduced ejection fraction- - NYHA class II - euvolemic today - weighing daily; reminded to call for an overnight weight gain of > 2 pounds or a weekly weight gain of >5 pounds - weight up 3 pounds from last visit here 6 weeks ago - not adding salt and is using Mrs. Dash for seasoning; has been reading food labels for sodium content; reviewed the importance of keeping daily sodium intake to ~ 2000mg  / day.  - saw cardiology ([facility]) 04/22/21 - on GDMT of metoprolol, entresto and spironolactone - BP will not allow for entresto titration and may not be able to tolerate SGLT2 -  saw EP Wilford Grist) 03/24/21 - AICD implanted 04/16/21  2: CAD- - had 2 stents placed December 2021 - BMP 04/16/21 reviewed and showed sodium 137, potassium 4.3, creatinine 1.3 and GFR 64  3: Tobacco use- - says that he hasn't smoked any since his admission December 2021 - congratulated on that and continued cessation encouraged   Medication bottles reviewed.   Return in 6 months or sooner for any questions/problems before then.

## 2021-12-02 ENCOUNTER — Encounter: Payer: Self-pay | Admitting: Family

## 2021-12-04 ENCOUNTER — Telehealth: Payer: Self-pay | Admitting: Family

## 2021-12-04 ENCOUNTER — Ambulatory Visit: Payer: 59 | Admitting: Family

## 2021-12-04 NOTE — Telephone Encounter (Signed)
Patient did not show for his [facility] appointment on 12/04/21. Will attempt to reschedule.   °

## 2021-12-18 ENCOUNTER — Encounter: Payer: Self-pay | Admitting: Family

## 2021-12-18 ENCOUNTER — Ambulatory Visit: Payer: BLUE CROSS/BLUE SHIELD | Admitting: Family

## 2021-12-18 ENCOUNTER — Other Ambulatory Visit: Payer: Self-pay

## 2021-12-18 ENCOUNTER — Other Ambulatory Visit: Payer: Self-pay | Admitting: Family

## 2021-12-18 ENCOUNTER — Ambulatory Visit: Payer: BLUE CROSS/BLUE SHIELD | Attending: Family | Admitting: Family

## 2021-12-18 VITALS — BP 118/87 | HR 75 | Resp 20 | Ht 70.0 in | Wt 190.0 lb

## 2021-12-18 DIAGNOSIS — Z87891 Personal history of nicotine dependence: Secondary | ICD-10-CM | POA: Insufficient documentation

## 2021-12-18 DIAGNOSIS — I2510 Atherosclerotic heart disease of native coronary artery without angina pectoris: Secondary | ICD-10-CM | POA: Insufficient documentation

## 2021-12-18 DIAGNOSIS — Z955 Presence of coronary angioplasty implant and graft: Secondary | ICD-10-CM | POA: Diagnosis not present

## 2021-12-18 DIAGNOSIS — I252 Old myocardial infarction: Secondary | ICD-10-CM | POA: Insufficient documentation

## 2021-12-18 DIAGNOSIS — Z95810 Presence of automatic (implantable) cardiac defibrillator: Secondary | ICD-10-CM | POA: Insufficient documentation

## 2021-12-18 DIAGNOSIS — I5022 Chronic systolic (congestive) heart failure: Secondary | ICD-10-CM | POA: Diagnosis not present

## 2021-12-18 DIAGNOSIS — Z720 Tobacco use: Secondary | ICD-10-CM | POA: Diagnosis not present

## 2021-12-18 MED ORDER — ATORVASTATIN CALCIUM 80 MG PO TABS: 80.0000 mg | ORAL_TABLET | Freq: Every day | ORAL | 3 refills | Status: DC

## 2021-12-18 MED ORDER — SPIRONOLACTONE 25 MG PO TABS: ORAL_TABLET | ORAL | 3 refills | Status: DC

## 2021-12-18 MED ORDER — METOPROLOL SUCCINATE ER 25 MG PO TB24: 25.0000 mg | ORAL_TABLET | Freq: Every day | ORAL | 3 refills | Status: DC

## 2021-12-18 NOTE — Progress Notes (Signed)
RX for metoprolol sent to pharmacy

## 2021-12-18 NOTE — Progress Notes (Signed)
Patient ID: Timothy Schultz, male    DOB: Oct 09, 1961, 61 y.o.   MRN: [id number]   Mr Fatima is a 61 y/o male with a history of CAD (MI/ stent), recent tobacco use and chronic heart failure.   Echo report from 01/15/21 reviewed and showed an EF of 30-35%. Echo report from 11/08/20 reviewed and showed an EF of 30-35%.  LHC completed 11/07/20 and showed: Prox LAD to Mid LAD lesion is 100% stenosed. 2nd Diag lesion is 70% stenosed. 1st Mrg lesion is 75% stenosed. Prox Cx to Mid Cx lesion is 60% stenosed. A drug-eluting stent was successfully placed using a STENT SYNERGY DES 3X28. Post intervention, there is a 0% residual stenosis. Mid LAD-1 lesion is 70% stenosed. Mid LAD-2 lesion is 40% stenosed. A drug-eluting stent was successfully placed using a STENT SYNERGY DES 2.50X8. Post intervention, there is a 0% residual stenosis. Mid LM lesion is 30% stenosed. Prox RCA lesion is 70% stenosed.   1.  Anterior ST elevation myocardial infarction 2.  Occluded proximal LAD 3.  Preserved left ventricular function 4.  Successful primary PCI with overlapping 3,0 x 28 mm and 2.5 x 8 mm Synergy stents proximal/mid LAD  Has not been admitted or been in the ED in the last 6 months.   He presents today for a follow-up visit with a chief complaint of minimal shortness of breath upon moderate exertion. He describes this as chronic in nature. He has associated fatigue along with this. He denies any difficulty sleeping, dizziness, abdominal distention, palpitations, pedal edema, chest pain, cough or weight gain.   Has been out of entresto for ~ the last week.   Past Medical History:  Diagnosis Date   CHF (congestive heart failure) (HCC)    Coronary artery disease    Tobacco use    Past Surgical History:  Procedure Laterality Date   CORONARY/GRAFT ACUTE MI REVASCULARIZATION N/A 11/07/2020   Procedure: Coronary/Graft Acute MI Revascularization;  Surgeon: Marcina Millard, MD;  Location: [facility]  INVASIVE CV LAB;  Service: Cardiovascular;  Laterality: N/A;   LEFT HEART CATH AND CORONARY ANGIOGRAPHY N/A 11/07/2020   Procedure: LEFT HEART CATH AND CORONARY ANGIOGRAPHY;  Surgeon: Marcina Millard, MD;  Location: [facility] INVASIVE CV LAB;  Service: Cardiovascular;  Laterality: N/A;   No family history on file. Social History   Tobacco Use   Smoking status: Former    Types: Cigarettes    Quit date: 11/29/2020    Years since quitting: 1.0   Smokeless tobacco: Never  Substance Use Topics   Alcohol use: Not Currently   No Known Allergies  Prior to Admission medications   Medication Sig Start Date End Date Taking? Authorizing Provider  aspirin EC 81 MG tablet Take 81 mg by mouth daily. Swallow whole.   Yes [provider]  atorvastatin (LIPITOR) 80 MG tablet Take 1 tablet (80 mg total) by mouth daily. 12/18/21 01/17/22 Yes Tait Balistreri A, FNP  buprenorphine (SUBUTEX) 8 MG SUBL SL tablet Place 8 mg under the tongue daily. 10/21/20  Yes [provider]  clopidogrel (PLAVIX) 75 MG tablet Take 75 mg by mouth daily.   Yes [provider]  metoprolol succinate (TOPROL-XL) 25 MG 24 hr tablet Take 25 mg by mouth daily.   Yes [provider]  spironolactone (ALDACTONE) 25 MG tablet TAKE 1 TABLET(25 MG) BY MOUTH DAILY 12/18/21  Yes Zurie Platas A, FNP  sacubitril-valsartan (ENTRESTO) 24-26 MG Take 1 tablet by mouth 2 (two) times daily. Patient not taking:  Reported on 12/18/2021 01/15/21   Delma Freeze, FNP    Review of Systems  Constitutional:  Positive for fatigue. Negative for appetite change.  HENT:  Negative for congestion, postnasal drip and sore throat.   Eyes: Negative.   Respiratory:  Positive for shortness of breath (with moderate exertion). Negative for cough.   Cardiovascular:  Negative for chest pain, palpitations and leg swelling.  Gastrointestinal:  Negative for abdominal distention and abdominal pain.  Endocrine: Negative.   Genitourinary:  Negative.   Musculoskeletal:  Negative for back pain and neck pain.  Skin: Negative.   Allergic/Immunologic: Negative.   Neurological:  Negative for dizziness and light-headedness.  Hematological:  Negative for adenopathy. Bruises/bleeds easily.  Psychiatric/Behavioral:  Negative for dysphoric mood and sleep disturbance (sleeping on 1 pillow). The patient is not nervous/anxious.    Vitals:   12/18/21 1036  BP: 118/87  Pulse: 75  Resp: 20  SpO2: 100%  Weight: 190 lb (86.2 kg)  Height: 5\' 10"  (1.778 m)   Wt Readings from Last 3 Encounters:  12/18/21 190 lb (86.2 kg)  06/03/21 189 lb 2 oz (85.8 kg)  04/07/21 186 lb 6 oz (84.5 kg)   Lab Results  Component Value Date   CREATININE 1.35 (H) 03/11/2021   CREATININE 1.29 (H) 02/10/2021   CREATININE 1.09 01/15/2021   Physical Exam Vitals and nursing note reviewed.  Constitutional:      Appearance: Normal appearance.  HENT:     Head: Normocephalic and atraumatic.  Cardiovascular:     Rate and Rhythm: Normal rate and regular rhythm.  Pulmonary:     Effort: Pulmonary effort is normal. No respiratory distress.     Breath sounds: No wheezing or rales.  Abdominal:     General: Abdomen is flat. There is no distension.     Palpations: Abdomen is soft.  Musculoskeletal:        General: No tenderness.     Cervical back: Normal range of motion and neck supple.     Right lower leg: No edema.     Left lower leg: No edema.  Skin:    General: Skin is warm and dry.  Neurological:     General: No focal deficit present.     Mental Status: He is alert and oriented to person, place, and time.  Psychiatric:        Mood and Affect: Mood normal.        Behavior: Behavior normal.        Thought Content: Thought content normal.   Assessment & Plan:  1: Chronic heart failure with reduced ejection fraction- - NYHA class II - euvolemic today - weighing daily; reminded to call for an overnight weight gain of > 2 pounds or a weekly weight gain  of >5 pounds - weight stable from last visit here 6 months ago - not adding salt and is using Mrs. Dash for seasoning; has been reading food labels for sodium content; reviewed the importance of keeping daily sodium intake to ~ 2000mg  / day.  - saw cardiology ([facility]) 11/03/21 - on GDMT of metoprolol & spironolactone; has been out of entresto - 1 month samples of entresto provided today and [employer] patient assistance application filled out today - plan to titrate up entresto or add SGLT2 at next visit - saw EP ) 03/24/21 - AICD implanted 04/16/21  2: CAD- - had 2 stents placed December 2021 - BMP 08/25/21 reviewed and showed sodium 139, potassium 4.3, creatinine 1.2 and GFR 62  3: Tobacco use- - says that he hasn't smoked any since his admission December 2021 - congratulated on that and continued cessation encouraged   Medication bottles reviewed.   Return in 1 month, sooner if needed

## 2021-12-18 NOTE — Patient Instructions (Addendum)
Continue weighing daily and call for an overnight weight gain of 3 pounds or more or a weekly weight gain of more than 5 pounds.   The [facility] Clinic will be moving around the corner to [id number] mid-February. Our phone number will remain the same    Bring [place] back W-2 information as soon as possible for your entresto

## 2021-12-21 ENCOUNTER — Telehealth: Payer: Self-pay | Admitting: Family

## 2021-12-21 NOTE — Telephone Encounter (Signed)
Notified patient that we need proof of income for [employer] patient assistance for Entresto in order to attempt to get him approved for assistance.   Timothy Schultz, NT

## 2021-12-25 ENCOUNTER — Encounter: Payer: Self-pay | Admitting: Family

## 2022-01-04 ENCOUNTER — Telehealth: Payer: Self-pay | Admitting: Family

## 2022-01-04 NOTE — Telephone Encounter (Signed)
Called and notified patient he was approved for [employer] patient assistance for Entresto until 11/28/22.   Shama Monfils, NT

## 2022-01-05 ENCOUNTER — Encounter: Payer: Self-pay | Admitting: Family

## 2022-01-16 NOTE — Progress Notes (Signed)
Patient ID: Timothy Schultz, male    DOB: 1961/02/05, 61 y.o.   MRN: [id number]   Timothy Schultz is a 61 y/o male with a history of CAD (MI/ stent), recent tobacco use and chronic heart failure.   Echo report from 01/15/21 reviewed and showed an EF of 30-35%. Echo report from 11/08/20 reviewed and showed an EF of 30-35%.  LHC completed 11/07/20 and showed: Prox LAD to Mid LAD lesion is 100% stenosed. 2nd Diag lesion is 70% stenosed. 1st Mrg lesion is 75% stenosed. Prox Cx to Mid Cx lesion is 60% stenosed. A drug-eluting stent was successfully placed using a STENT SYNERGY DES 3X28. Post intervention, there is a 0% residual stenosis. Mid LAD-1 lesion is 70% stenosed. Mid LAD-2 lesion is 40% stenosed. A drug-eluting stent was successfully placed using a STENT SYNERGY DES 2.50X8. Post intervention, there is a 0% residual stenosis. Mid LM lesion is 30% stenosed. Prox RCA lesion is 70% stenosed.   1.  Anterior ST elevation myocardial infarction 2.  Occluded proximal LAD 3.  Preserved left ventricular function 4.  Successful primary PCI with overlapping 3,0 x 28 mm and 2.5 x 8 mm Synergy stents proximal/mid LAD  Has not been admitted or been in the ED in the last 6 months.   He presents today for a follow-up visit with a chief complaint of minimal shortness of breath with moderate exertion. He describes this as chronic in nature. He has associated fatigue and easy bruising along with this. He denies any difficulty sleeping, dizziness, abdominal distention, palpitations, pedal edema, chest pain, cough or weight gain.   Past Medical History:  Diagnosis Date   CHF (congestive heart failure) (HCC)    Coronary artery disease    Tobacco use    Past Surgical History:  Procedure Laterality Date   CORONARY/GRAFT ACUTE MI REVASCULARIZATION N/A 11/07/2020   Procedure: Coronary/Graft Acute MI Revascularization;  Surgeon: Marcina Millard, MD;  Location: [facility];  Service:  Cardiovascular;  Laterality: N/A;   LEFT HEART CATH AND CORONARY ANGIOGRAPHY N/A 11/07/2020   Procedure: LEFT HEART CATH AND CORONARY ANGIOGRAPHY;  Surgeon: Marcina Millard, MD;  Location: [facility];  Service: Cardiovascular;  Laterality: N/A;   No family history on file. Social History   Tobacco Use   Smoking status: Former    Types: Cigarettes    Quit date: 11/29/2020    Years since quitting: 1.1   Smokeless tobacco: Never  Substance Use Topics   Alcohol use: Not Currently   No Known Allergies  Prior to Admission medications   Medication Sig Start Date End Date Taking? Authorizing Provider  aspirin EC 81 MG tablet Take 81 mg by mouth daily. Swallow whole.   Yes [provider]  buprenorphine (SUBUTEX) 8 MG SUBL SL tablet Place 8 mg under the tongue daily. 10/21/20  Yes [provider]  clopidogrel (PLAVIX) 75 MG tablet Take 75 mg by mouth daily.   Yes [provider]  metoprolol succinate (TOPROL-XL) 25 MG 24 hr tablet Take 1 tablet (25 mg total) by mouth daily. 12/18/21  Yes Jalesia Loudenslager A, FNP  sacubitril-valsartan (ENTRESTO) 24-26 MG Take 1 tablet by mouth 2 (two) times daily. 01/15/21  Yes Clarisa Kindred A, FNP  spironolactone (ALDACTONE) 25 MG tablet TAKE 1 TABLET(25 MG) BY MOUTH DAILY 12/18/21  Yes Clarisa Kindred A, FNP  atorvastatin (LIPITOR) 80 MG tablet Take 1 tablet (80 mg total) by mouth daily. 12/18/21 01/17/22  Delma Freeze, FNP  Review of Systems  Constitutional:  Positive for fatigue. Negative for appetite change.  HENT:  Negative for congestion, postnasal drip and sore throat.   Eyes: Negative.   Respiratory:  Positive for shortness of breath (with moderate exertion). Negative for cough.   Cardiovascular:  Negative for chest pain, palpitations and leg swelling.  Gastrointestinal:  Negative for abdominal distention and abdominal pain.  Endocrine: Negative.   Genitourinary: Negative.   Musculoskeletal:  Negative for back pain  and neck pain.  Skin: Negative.   Allergic/Immunologic: Negative.   Neurological:  Negative for dizziness and light-headedness.  Hematological:  Negative for adenopathy. Bruises/bleeds easily.  Psychiatric/Behavioral:  Negative for dysphoric mood and sleep disturbance (sleeping on 1 pillow). The patient is not nervous/anxious.    Vitals:   01/18/22 1238  BP: 90/63  Pulse: 95  Resp: 20  SpO2: 95%  Weight: 190 lb 5 oz (86.3 kg)  Height: 5\' 10"  (1.778 m)   Wt Readings from Last 3 Encounters:  01/18/22 190 lb 5 oz (86.3 kg)  12/18/21 190 lb (86.2 kg)  06/03/21 189 lb 2 oz (85.8 kg)   Lab Results  Component Value Date   CREATININE 1.35 (H) 03/11/2021   CREATININE 1.29 (H) 02/10/2021   CREATININE 1.09 01/15/2021   Physical Exam Vitals and nursing note reviewed.  Constitutional:      Appearance: Normal appearance.  HENT:     Head: Normocephalic and atraumatic.  Cardiovascular:     Rate and Rhythm: Normal rate and regular rhythm.  Pulmonary:     Effort: Pulmonary effort is normal. No respiratory distress.     Breath sounds: No wheezing or rales.  Abdominal:     General: Abdomen is flat. There is no distension.     Palpations: Abdomen is soft.  Musculoskeletal:        General: No tenderness.     Cervical back: Normal range of motion and neck supple.     Right lower leg: No edema.     Left lower leg: No edema.  Skin:    General: Skin is warm and dry.  Neurological:     General: No focal deficit present.     Mental Status: He is alert and oriented to person, place, and time.  Psychiatric:        Mood and Affect: Mood normal.        Behavior: Behavior normal.        Thought Content: Thought content normal.   Assessment & Plan:  1: Chronic heart failure with reduced ejection fraction- - NYHA class II - euvolemic today - weighing daily; reminded to call for an overnight weight gain of > 2 pounds or a weekly weight gain of >5 pounds - weight stable from last visit here  1 month ago - not adding salt and is using Mrs. Dash for seasoning; has been reading food labels for sodium content; reviewed the importance of keeping daily sodium intake to ~ 2000mg  / day.  - saw cardiology (Paraschos) 11/03/21 - on GDMT of metoprolol, entresto & spironolactone - current BP does not allow for titration of GDMT or adding SGLT2; if BP remains low, may need to decrease his spironolactone - saw EP ) 03/24/21 - AICD implanted 04/16/21  2: CAD- - had 2 stents placed December 2021 - BMP 08/25/21 reviewed and showed sodium 139, potassium 4.3, creatinine 1.2 and GFR 62  3: Tobacco use- - says that he hasn't smoked any since his admission December 2021 - congratulated on that  and continued cessation encouraged   Medication bottles reviewed.   Return in 2 months, sooner if needed.

## 2022-01-18 ENCOUNTER — Encounter: Payer: Self-pay | Admitting: Family

## 2022-01-18 ENCOUNTER — Ambulatory Visit: Payer: BLUE CROSS/BLUE SHIELD | Attending: Family | Admitting: Family

## 2022-01-18 ENCOUNTER — Other Ambulatory Visit: Payer: Self-pay

## 2022-01-18 VITALS — BP 90/63 | HR 95 | Resp 20 | Ht 70.0 in | Wt 190.3 lb

## 2022-01-18 DIAGNOSIS — I5022 Chronic systolic (congestive) heart failure: Secondary | ICD-10-CM | POA: Insufficient documentation

## 2022-01-18 DIAGNOSIS — Z09 Encounter for follow-up examination after completed treatment for conditions other than malignant neoplasm: Secondary | ICD-10-CM | POA: Diagnosis not present

## 2022-01-18 DIAGNOSIS — R0602 Shortness of breath: Secondary | ICD-10-CM | POA: Diagnosis present

## 2022-01-18 DIAGNOSIS — Z720 Tobacco use: Secondary | ICD-10-CM

## 2022-01-18 DIAGNOSIS — Z95810 Presence of automatic (implantable) cardiac defibrillator: Secondary | ICD-10-CM | POA: Diagnosis not present

## 2022-01-18 DIAGNOSIS — Z79899 Other long term (current) drug therapy: Secondary | ICD-10-CM | POA: Insufficient documentation

## 2022-01-18 DIAGNOSIS — I2510 Atherosclerotic heart disease of native coronary artery without angina pectoris: Secondary | ICD-10-CM | POA: Diagnosis not present

## 2022-01-18 DIAGNOSIS — R5383 Other fatigue: Secondary | ICD-10-CM | POA: Insufficient documentation

## 2022-01-18 DIAGNOSIS — I252 Old myocardial infarction: Secondary | ICD-10-CM | POA: Diagnosis not present

## 2022-01-18 DIAGNOSIS — Z7901 Long term (current) use of anticoagulants: Secondary | ICD-10-CM | POA: Insufficient documentation

## 2022-01-18 DIAGNOSIS — Z87891 Personal history of nicotine dependence: Secondary | ICD-10-CM | POA: Insufficient documentation

## 2022-01-18 NOTE — Patient Instructions (Signed)
Continue weighing daily and call for an overnight weight gain of 3 pounds or more or a weekly weight gain of more than 5 pounds.  °

## 2022-03-18 ENCOUNTER — Encounter: Payer: Self-pay | Admitting: Family

## 2022-03-18 ENCOUNTER — Ambulatory Visit: Payer: BLUE CROSS/BLUE SHIELD | Attending: Family | Admitting: Family

## 2022-03-18 VITALS — BP 89/66 | HR 78 | Resp 20 | Ht 70.0 in | Wt 191.0 lb

## 2022-03-18 DIAGNOSIS — I952 Hypotension due to drugs: Secondary | ICD-10-CM

## 2022-03-18 DIAGNOSIS — Z955 Presence of coronary angioplasty implant and graft: Secondary | ICD-10-CM | POA: Diagnosis not present

## 2022-03-18 DIAGNOSIS — I252 Old myocardial infarction: Secondary | ICD-10-CM | POA: Diagnosis not present

## 2022-03-18 DIAGNOSIS — I959 Hypotension, unspecified: Secondary | ICD-10-CM | POA: Diagnosis not present

## 2022-03-18 DIAGNOSIS — Z79899 Other long term (current) drug therapy: Secondary | ICD-10-CM | POA: Diagnosis not present

## 2022-03-18 DIAGNOSIS — Z95810 Presence of automatic (implantable) cardiac defibrillator: Secondary | ICD-10-CM | POA: Diagnosis not present

## 2022-03-18 DIAGNOSIS — Z720 Tobacco use: Secondary | ICD-10-CM

## 2022-03-18 DIAGNOSIS — F17210 Nicotine dependence, cigarettes, uncomplicated: Secondary | ICD-10-CM | POA: Insufficient documentation

## 2022-03-18 DIAGNOSIS — I5022 Chronic systolic (congestive) heart failure: Secondary | ICD-10-CM | POA: Diagnosis present

## 2022-03-18 DIAGNOSIS — I2510 Atherosclerotic heart disease of native coronary artery without angina pectoris: Secondary | ICD-10-CM

## 2022-03-18 NOTE — Progress Notes (Signed)
? Patient ID: Timothy Schultz, male    DOB: 1961/11/22, 61 y.o.   MRN: [id number] ? ? ?[name] is a 61 y/o male with a history of CAD (MI/ stent), recent tobacco use and chronic heart failure.  ? ?Echo report from 01/15/21 reviewed and showed an EF of 30-35%. Echo report from 11/08/20 reviewed and showed an EF of 30-35%. ? ?LHC completed 11/07/20 and showed: ?Prox LAD to Mid LAD lesion is 100% stenosed. ?2nd Diag lesion is 70% stenosed. ?1st Mrg lesion is 75% stenosed. ?Prox Cx to Mid Cx lesion is 60% stenosed. ?A drug-eluting stent was successfully placed using a STENT SYNERGY DES 3X28. ?Post intervention, there is a 0% residual stenosis. ?Mid LAD-1 lesion is 70% stenosed. ?Mid LAD-2 lesion is 40% stenosed. ?A drug-eluting stent was successfully placed using a STENT SYNERGY DES 2.50X8. ?Post intervention, there is a 0% residual stenosis. ?Mid LM lesion is 30% stenosed. ?Prox RCA lesion is 70% stenosed. ?  ?1.  Anterior ST elevation myocardial infarction ?2.  Occluded proximal LAD ?3.  Preserved left ventricular function ?4.  Successful primary PCI with overlapping 3,0 x 28 mm and 2.5 x 8 mm Synergy stents proximal/mid LAD ? ?Has not been admitted or been in the ED in the last 6 months.  ? ?He presents today for a follow-up visit with a chief complaint of minimal fatigue upon moderate exertion. Describes this as chronic in nature having been present for several years. He has associated shortness of breath & easy bruising along with this. He denies any difficulty sleeping, dizziness, abdominal distention, palpitations, pedal edema, chest pain, cough or weight gain.  ? ?Has a BP monitor at home but hasn't been using it. Overall, he says that he feels quite well.  ? ?Past Medical History:  ?Diagnosis Date  ? CHF (congestive heart failure) (HCC)   ? Coronary artery disease   ? Tobacco use   ? ?Past Surgical History:  ?Procedure Laterality Date  ? CORONARY/GRAFT ACUTE MI REVASCULARIZATION N/A 11/07/2020  ? Procedure:  Coronary/Graft Acute MI Revascularization;  Surgeon: Marcina MillardParaschos, Alexander, MD;  Location: [facility];  Service: Cardiovascular;  Laterality: N/A;  ? LEFT HEART CATH AND CORONARY ANGIOGRAPHY N/A 11/07/2020  ? Procedure: LEFT HEART CATH AND CORONARY ANGIOGRAPHY;  Surgeon: Marcina MillardParaschos, Alexander, MD;  Location: [facility];  Service: Cardiovascular;  Laterality: N/A;  ? ?No family history on file. ?Social History  ? ?Tobacco Use  ? Smoking status: Former  ?  Types: Cigarettes  ?  Quit date: 11/29/2020  ?  Years since quitting: 1.2  ? Smokeless tobacco: Never  ?Substance Use Topics  ? Alcohol use: Not Currently  ? ?No Known Allergies ? ?Prior to Admission medications   ?Medication Sig Start Date End Date Taking? Authorizing Provider  ?aspirin EC 81 MG tablet Take 81 mg by mouth daily. Swallow whole.   Yes [provider]  ?buprenorphine (SUBUTEX) 8 MG SUBL SL tablet Place 8 mg under the tongue daily. 10/21/20  Yes [provider]  ?clopidogrel (PLAVIX) 75 MG tablet Take 75 mg by mouth daily.   Yes [provider]  ?metoprolol succinate (TOPROL-XL) 25 MG 24 hr tablet Take 1 tablet (25 mg total) by mouth daily. 12/18/21  Yes Delma FreezeHackney, Shlok Raz A, FNP  ?sacubitril-valsartan (ENTRESTO) 24-26 MG Take 1 tablet by mouth 2 (two) times daily. 01/15/21  Yes Delma FreezeHackney, Donisha Hoch A, FNP  ?spironolactone (ALDACTONE) 25 MG tablet TAKE 1 TABLET(25 MG) BY MOUTH DAILY 12/18/21  Yes OlsburgHackney,  Jarold Song, FNP  ?atorvastatin (LIPITOR) 80 MG tablet Take 1 tablet (80 mg total) by mouth daily. 12/18/21 01/17/22  Delma Freeze, FNP  ? ?Review of Systems  ?Constitutional:  Positive for fatigue. Negative for appetite change.  ?HENT:  Negative for congestion, postnasal drip and sore throat.   ?Eyes: Negative.   ?Respiratory:  Positive for shortness of breath (with moderate exertion). Negative for cough.   ?Cardiovascular:  Negative for chest pain, palpitations and leg swelling.  ?Gastrointestinal:  Negative for abdominal  distention and abdominal pain.  ?Endocrine: Negative.   ?Genitourinary: Negative.   ?Musculoskeletal:  Negative for back pain and neck pain.  ?Skin: Negative.   ?Allergic/Immunologic: Negative.   ?Neurological:  Negative for dizziness and light-headedness.  ?Hematological:  Negative for adenopathy. Bruises/bleeds easily.  ?Psychiatric/Behavioral:  Negative for dysphoric mood and sleep disturbance (sleeping on 1 pillow). The patient is not nervous/anxious.   ? ?Vitals:  ? 03/18/22 1014  ?BP: (!) 89/66  ?Pulse: 78  ?Resp: 20  ?SpO2: 98%  ?Weight: 191 lb (86.6 kg)  ?Height: 5\' 10"  (1.778 m)  ? ?Wt Readings from Last 3 Encounters:  ?03/18/22 191 lb (86.6 kg)  ?01/18/22 190 lb 5 oz (86.3 kg)  ?12/18/21 190 lb (86.2 kg)  ? ?Lab Results  ?Component Value Date  ? CREATININE 1.35 (H) 03/11/2021  ? CREATININE 1.29 (H) 02/10/2021  ? CREATININE 1.09 01/15/2021  ? ?Physical Exam ?Vitals and nursing note reviewed.  ?Constitutional:   ?   Appearance: Normal appearance.  ?HENT:  ?   Head: Normocephalic and atraumatic.  ?Cardiovascular:  ?   Rate and Rhythm: Normal rate and regular rhythm.  ?Pulmonary:  ?   Effort: Pulmonary effort is normal. No respiratory distress.  ?   Breath sounds: No wheezing or rales.  ?Abdominal:  ?   General: Abdomen is flat. There is no distension.  ?   Palpations: Abdomen is soft.  ?Musculoskeletal:     ?   General: No tenderness.  ?   Cervical back: Normal range of motion and neck supple.  ?   Right lower leg: No edema.  ?   Left lower leg: No edema.  ?Skin: ?   General: Skin is warm and dry.  ?Neurological:  ?   General: No focal deficit present.  ?   Mental Status: He is alert and oriented to person, place, and time.  ?Psychiatric:     ?   Mood and Affect: Mood normal.     ?   Behavior: Behavior normal.     ?   Thought Content: Thought content normal.  ? ?Assessment & Plan: ? ?1: Chronic heart failure with reduced ejection fraction- ?- NYHA class II ?- euvolemic today ?- weighing daily; reminded to  call for an overnight weight gain of > 2 pounds or a weekly weight gain of >5 pounds ?- weight stable from last visit here 2 months ago ?- not adding salt and is using Mrs. Dash for seasoning; has been reading food labels for sodium content; reviewed the importance of keeping daily sodium intake to ~ 2000mg  / day.  ?- on GDMT of metoprolol, entresto & spironolactone ?- current BP does not allow for titration of GDMT or adding SGLT2 ?- saw EP 01/17/2021) 03/24/21 ?- AICD implanted 04/16/21 ? ?2: CAD- ?- had 2 stents placed December 2021 ?- saw cardiology (Paraschos) 03/04/22 ? ?3: Hypotension- ?- BP soft (89/66) but without dizziness ?- decrease spironolactone to 12.5mg  daily; if he has difficulty cutting  it, he will call us back ?- BMP 08/25/21 reviewed and showed sodium 139, potassium 4.3, creatinine 1.2 and GFR 62 ? ?4: Tobacco use- ?- says that he hasn't smoked any since his admission December 2021 ?- congratulated on that and continued cessation encouraged ? ? ?Medication bottles reviewed.  ? ?Return in 1 month, sooner if needed.  ? ? ? ? ? ? ? ? ? ? ?  ?

## 2022-03-18 NOTE — Patient Instructions (Addendum)
Continue weighing daily and call for an overnight weight gain of 3 pounds or more or a weekly weight gain of more than 5 pounds. ? ? ?If you have voicemail, please make sure your mailbox is cleaned out so that we may leave a message and please make sure to listen to any voicemails.  ? ? ?Cut your spironolactone and take a 1/2 tablet every day ?

## 2022-04-18 NOTE — Progress Notes (Signed)
Patient ID: Timothy Schultz, male    DOB: [birth year], 61 y.o.   MRN: [id number]   Timothy Schultz is a 61 y/o male with a history of CAD (MI/ stent), recent tobacco use and chronic heart failure.   Echo report from 01/15/21 reviewed and showed an EF of 30-35%. Echo report from 11/08/20 reviewed and showed an EF of 30-35%.  LHC completed 11/07/20 and showed: Prox LAD to Mid LAD lesion is 100% stenosed. 2nd Diag lesion is 70% stenosed. 1st Mrg lesion is 75% stenosed. Prox Cx to Mid Cx lesion is 60% stenosed. A drug-eluting stent was successfully placed using a STENT SYNERGY DES 3X28. Post intervention, there is a 0% residual stenosis. Mid LAD-1 lesion is 70% stenosed. Mid LAD-2 lesion is 40% stenosed. A drug-eluting stent was successfully placed using a STENT SYNERGY DES 2.50X8. Post intervention, there is a 0% residual stenosis. Mid LM lesion is 30% stenosed. Prox RCA lesion is 70% stenosed.   1.  Anterior ST elevation myocardial infarction 2.  Occluded proximal LAD 3.  Preserved left ventricular function 4.  Successful primary PCI with overlapping 3,0 x 28 mm and 2.5 x 8 mm Synergy stents proximal/mid LAD  Has not been admitted or been in the ED in the last 6 months.   He presents today for a follow-up visit with a chief complaint of minimal fatigue upon moderate exertion. Describes this as chronic in nature although improving since spironolactone was decreased at last visit. He has associated shortness of breath along with this. He denies any difficulty sleeping, dizziness, abdominal distention, palpitations, pedal edema, chest pain, cough or weight gain.   Past Medical History:  Diagnosis Date   CHF (congestive heart failure) (HCC)    Coronary artery disease    Tobacco use    Past Surgical History:  Procedure Laterality Date   CORONARY/GRAFT ACUTE MI REVASCULARIZATION N/A 11/07/2020   Procedure: Coronary/Graft Acute MI Revascularization;  Surgeon: Isaias Cowman, MD;   Location: [facility];  Service: Cardiovascular;  Laterality: N/A;   LEFT HEART CATH AND CORONARY ANGIOGRAPHY N/A 11/07/2020   Procedure: LEFT HEART CATH AND CORONARY ANGIOGRAPHY;  Surgeon: Isaias Cowman, MD;  Location: [facility];  Service: Cardiovascular;  Laterality: N/A;   No family history on file. Social History   Tobacco Use   Smoking status: Former    Types: Cigarettes    Quit date: 11/29/2020    Years since quitting: 1.3   Smokeless tobacco: Never  Substance Use Topics   Alcohol use: Not Currently   No Known Allergies  Prior to Admission medications   Medication Sig Start Date End Date Taking? Authorizing Provider  aspirin EC 81 MG tablet Take 81 mg by mouth daily. Swallow whole.   Yes [provider]  atorvastatin (LIPITOR) 80 MG tablet Take 1 tablet (80 mg total) by mouth daily. 12/18/21  Yes Leesa Leifheit A, FNP  buprenorphine (SUBUTEX) 8 MG SUBL SL tablet Place 8 mg under the tongue daily. 10/21/20  Yes [provider]  clopidogrel (PLAVIX) 75 MG tablet Take 75 mg by mouth daily.   Yes [provider]  metoprolol succinate (TOPROL-XL) 25 MG 24 hr tablet Take 1 tablet (25 mg total) by mouth daily. 12/18/21  Yes Enedelia Martorelli A, FNP  sacubitril-valsartan (ENTRESTO) 24-26 MG Take 1 tablet by mouth 2 (two) times daily. 01/15/21  Yes Heidee Audi, Otila Kluver A, FNP  spironolactone (ALDACTONE) 25 MG tablet TAKE 1 TABLET(25 MG) BY MOUTH DAILY Patient taking differently:  12.5 mg.  12/18/21  Yes Alisa Graff, FNP    Review of Systems  Constitutional:  Positive for fatigue (improving). Negative for appetite change.  HENT:  Negative for congestion, postnasal drip and sore throat.   Eyes: Negative.   Respiratory:  Positive for shortness of breath (with moderate exertion). Negative for cough.   Cardiovascular:  Negative for chest pain, palpitations and leg swelling.  Gastrointestinal:  Negative for abdominal distention and abdominal pain.   Endocrine: Negative.   Genitourinary: Negative.   Musculoskeletal:  Negative for back pain and neck pain.  Skin: Negative.   Allergic/Immunologic: Negative.   Neurological:  Negative for dizziness and light-headedness.  Hematological:  Negative for adenopathy. Bruises/bleeds easily.  Psychiatric/Behavioral:  Negative for dysphoric mood and sleep disturbance (sleeping on 1 pillow). The patient is not nervous/anxious.    Vitals:   04/19/22 1300  BP: 110/81  Pulse: 86  Resp: 20  SpO2: 99%  Weight: 190 lb (86.2 kg)  Height: 5\' 11"  (1.803 m)   Wt Readings from Last 3 Encounters:  04/19/22 190 lb (86.2 kg)  03/18/22 191 lb (86.6 kg)  01/18/22 190 lb 5 oz (86.3 kg)   Lab Results  Component Value Date   CREATININE 1.35 (H) 03/11/2021   CREATININE 1.29 (H) 02/10/2021   CREATININE 1.09 01/15/2021   Physical Exam Vitals and nursing note reviewed.  Constitutional:      Appearance: Normal appearance.  HENT:     Head: Normocephalic and atraumatic.  Cardiovascular:     Rate and Rhythm: Normal rate and regular rhythm.  Pulmonary:     Effort: Pulmonary effort is normal. No respiratory distress.     Breath sounds: No wheezing or rales.  Abdominal:     General: Abdomen is flat. There is no distension.     Palpations: Abdomen is soft.  Musculoskeletal:        General: No tenderness.     Cervical back: Normal range of motion and neck supple.     Right lower leg: No edema.     Left lower leg: No edema.  Skin:    General: Skin is warm and dry.  Neurological:     General: No focal deficit present.     Mental Status: He is alert and oriented to person, place, and time.  Psychiatric:        Mood and Affect: Mood normal.        Behavior: Behavior normal.        Thought Content: Thought content normal.   Assessment & Plan:  1: Chronic heart failure with reduced ejection fraction- - NYHA class II - euvolemic today - weighing daily; reminded to call for an overnight weight gain of  > 2 pounds or a weekly weight gain of >5 pounds - weight stable from last visit here 1 month ago - not adding salt and is using Mrs. Dash for seasoning; has been reading food labels for sodium content; reviewed the importance of keeping daily sodium intake to ~ 2000mg  / day.  - on GDMT of metoprolol, entresto & spironolactone - 1 month samples of entresto 24/26mg  provided and he was instructed to call the 1-800# on his bottle to request next shipment as he gets 90 day supply from [employer] patient assistance - consider SGLT2 in the future if BP remains normal - saw EP Dwana Curd) 03/24/21 - AICD implanted 04/16/21  2: CAD- - had 2 stents placed December 2021 - saw cardiology (Staley) 03/04/22  3: Hypotension- - BP  looks good today (110/81) - spironolactone decreased to 12.5mg  at last visit & he says that he feels less fatigued since this was decreased - BMP 08/25/21 reviewed and showed sodium 139, potassium 4.3, creatinine 1.2 and GFR 62 - check BMP today  4: Tobacco use- - says that he hasn't smoked any since his admission December 2021 - congratulated on that and continued cessation encouraged   Medication bottles reviewed.   Return in 3 months, sooner if needed.

## 2022-04-19 ENCOUNTER — Encounter: Payer: Self-pay | Admitting: Family

## 2022-04-19 ENCOUNTER — Ambulatory Visit: Payer: BLUE CROSS/BLUE SHIELD | Attending: Family | Admitting: Family

## 2022-04-19 ENCOUNTER — Other Ambulatory Visit
Admission: RE | Admit: 2022-04-19 | Discharge: 2022-04-19 | Disposition: A | Discharge status: Home / Self Care | Payer: BLUE CROSS/BLUE SHIELD | Source: Ambulatory Visit | Attending: Family | Admitting: Family

## 2022-04-19 VITALS — BP 110/81 | HR 86 | Resp 20 | Ht 71.0 in | Wt 190.0 lb

## 2022-04-19 DIAGNOSIS — I959 Hypotension, unspecified: Secondary | ICD-10-CM | POA: Insufficient documentation

## 2022-04-19 DIAGNOSIS — I952 Hypotension due to drugs: Secondary | ICD-10-CM

## 2022-04-19 DIAGNOSIS — I5022 Chronic systolic (congestive) heart failure: Secondary | ICD-10-CM | POA: Diagnosis present

## 2022-04-19 DIAGNOSIS — Z87891 Personal history of nicotine dependence: Secondary | ICD-10-CM | POA: Insufficient documentation

## 2022-04-19 DIAGNOSIS — Z95810 Presence of automatic (implantable) cardiac defibrillator: Secondary | ICD-10-CM | POA: Insufficient documentation

## 2022-04-19 DIAGNOSIS — Z720 Tobacco use: Secondary | ICD-10-CM

## 2022-04-19 DIAGNOSIS — I252 Old myocardial infarction: Secondary | ICD-10-CM | POA: Diagnosis not present

## 2022-04-19 DIAGNOSIS — Z955 Presence of coronary angioplasty implant and graft: Secondary | ICD-10-CM | POA: Diagnosis not present

## 2022-04-19 DIAGNOSIS — I2510 Atherosclerotic heart disease of native coronary artery without angina pectoris: Secondary | ICD-10-CM

## 2022-04-19 LAB — BASIC METABOLIC PANEL
Anion gap: 8 (ref 5–15)
BUN: 14 mg/dL (ref 6–20)
CO2: 25 mmol/L (ref 22–32)
Calcium: 8.8 mg/dL — ABNORMAL LOW (ref 8.9–10.3)
Chloride: 106 mmol/L (ref 98–111)
Creatinine, Ser: 1.18 mg/dL (ref 0.61–1.24)
GFR, Estimated: >60 mL/min (ref >60)
Glucose, Bld: 95 mg/dL (ref 70–99)
Potassium: 3.6 mmol/L (ref 3.5–5.1)
Sodium: 139 mmol/L (ref 135–145)

## 2022-04-19 NOTE — Patient Instructions (Addendum)
Continue weighing daily and call for an overnight weight gain of 3 pounds or more or a weekly weight gain of more than 5 pounds.   If you have voicemail, please make sure your mailbox is cleaned out so that we may leave a message and please make sure to listen to any voicemails.    Call entresto and request refills

## 2022-05-11 ENCOUNTER — Encounter: Payer: Self-pay | Admitting: Family

## 2022-07-20 ENCOUNTER — Ambulatory Visit: Payer: BLUE CROSS/BLUE SHIELD | Admitting: Family

## 2022-07-21 ENCOUNTER — Ambulatory Visit: Payer: BLUE CROSS/BLUE SHIELD | Attending: Family | Admitting: Family

## 2022-07-21 ENCOUNTER — Encounter: Payer: Self-pay | Admitting: Family

## 2022-07-21 VITALS — BP 99/74 | HR 73 | Resp 20 | Ht 70.0 in | Wt 181.0 lb

## 2022-07-21 DIAGNOSIS — Z720 Tobacco use: Secondary | ICD-10-CM

## 2022-07-21 DIAGNOSIS — I5022 Chronic systolic (congestive) heart failure: Secondary | ICD-10-CM | POA: Diagnosis present

## 2022-07-21 DIAGNOSIS — I959 Hypotension, unspecified: Secondary | ICD-10-CM | POA: Diagnosis not present

## 2022-07-21 DIAGNOSIS — Z95810 Presence of automatic (implantable) cardiac defibrillator: Secondary | ICD-10-CM | POA: Insufficient documentation

## 2022-07-21 DIAGNOSIS — I252 Old myocardial infarction: Secondary | ICD-10-CM | POA: Insufficient documentation

## 2022-07-21 DIAGNOSIS — I952 Hypotension due to drugs: Secondary | ICD-10-CM | POA: Diagnosis not present

## 2022-07-21 DIAGNOSIS — I2510 Atherosclerotic heart disease of native coronary artery without angina pectoris: Secondary | ICD-10-CM | POA: Diagnosis not present

## 2022-07-21 NOTE — Patient Instructions (Addendum)
Continue weighing daily and call for an overnight weight gain of 3 pounds or more or a weekly weight gain of more than 5 pounds.   If you have voicemail, please make sure your mailbox is cleaned out so that we may leave a message and please make sure to listen to any voicemails.    Stop taking spironolactone due to your low blood pressure   Drink 1 can of ensure every evening

## 2022-07-21 NOTE — Progress Notes (Signed)
Patient ID: Timothy Schultz, male    DOB: [birth year], 61 y.o.   MRN: [id number]   Timothy Schultz is a 60 y/o male with a history of CAD (MI/ stent), recent tobacco use and chronic heart failure.   Echo report from 01/15/21 reviewed and showed an EF of 30-35%. Echo report from 11/08/20 reviewed and showed an EF of 30-35%.  LHC completed 11/07/20 and showed: Prox LAD to Mid LAD lesion is 100% stenosed. 2nd Diag lesion is 70% stenosed. 1st Mrg lesion is 75% stenosed. Prox Cx to Mid Cx lesion is 60% stenosed. A drug-eluting stent was successfully placed using a STENT SYNERGY DES 3X28. Post intervention, there is a 0% residual stenosis. Mid LAD-1 lesion is 70% stenosed. Mid LAD-2 lesion is 40% stenosed. A drug-eluting stent was successfully placed using a STENT SYNERGY DES 2.50X8. Post intervention, there is a 0% residual stenosis. Mid LM lesion is 30% stenosed. Prox RCA lesion is 70% stenosed.   1.  Anterior ST elevation myocardial infarction 2.  Occluded proximal LAD 3.  Preserved left ventricular function 4.  Successful primary PCI with overlapping 3,0 x 28 mm and 2.5 x 8 mm Synergy stents proximal/mid LAD  Has not been admitted or been in the ED in the last 6 months.   He presents today for a follow-up visit with a chief complaint of minimal fatigue upon moderate exertion. He describes this as chronic in nature. He has associated easy bruising and weight loss along with this. He denies any difficulty sleeping, abdominal distention, palpitations, pedal edema, chest pain, shortness of breath, cough or dizziness.   Says that he had all his lower teeth removed and is still having soreness making it difficult to eat. He also says that because of the pain, he can't wear his dentures yet so foods like steak are "impossible" to eat.   Past Medical History:  Diagnosis Date   CHF (congestive heart failure) (HCC)    Coronary artery disease    Tobacco use    Past Surgical History:  Procedure  Laterality Date   CORONARY/GRAFT ACUTE MI REVASCULARIZATION N/A 11/07/2020   Procedure: Coronary/Graft Acute MI Revascularization;  Surgeon: Timothy Millard, MD;  Location: [facility];  Service: Cardiovascular;  Laterality: N/A;   LEFT HEART CATH AND CORONARY ANGIOGRAPHY N/A 11/07/2020   Procedure: LEFT HEART CATH AND CORONARY ANGIOGRAPHY;  Surgeon: Timothy Millard, MD;  Location: [facility];  Service: Cardiovascular;  Laterality: N/A;   No family history on file. Social History   Tobacco Use   Smoking status: Former    Types: Cigarettes    Quit date: 11/29/2020    Years since quitting: 1.6   Smokeless tobacco: Never  Substance Use Topics   Alcohol use: Not Currently   No Known Allergies  Prior to Admission medications   Medication Sig Start Date End Date Taking? Authorizing Provider  aspirin EC 81 MG tablet Take 81 mg by mouth daily. Swallow whole.   Yes [provider]  atorvastatin (LIPITOR) 80 MG tablet Take 1 tablet (80 mg total) by mouth daily. 12/18/21  Yes Margaurite Salido A, FNP  buprenorphine (SUBUTEX) 8 MG SUBL SL tablet Place 8 mg under the tongue daily. 10/21/20  Yes [provider]  clopidogrel (PLAVIX) 75 MG tablet Take 75 mg by mouth daily.   Yes [provider]  metoprolol succinate (TOPROL-XL) 25 MG 24 hr tablet Take 1 tablet (25 mg total) by mouth daily. 12/18/21  Yes Delma Freeze,  FNP  sacubitril-valsartan (ENTRESTO) 24-26 MG Take 1 tablet by mouth 2 (two) times daily. 01/15/21  Yes Nellene Courtois, Inetta Fermo A, FNP  spironolactone (ALDACTONE) 25 MG tablet TAKE 1 TABLET(25 MG) BY MOUTH DAILY Patient taking differently: 12.5 mg. TAKE 1 TABLET(25 MG) BY MOUTH DAILY 12/18/21  Yes Delma Freeze, FNP   Review of Systems  Constitutional:  Positive for fatigue. Negative for appetite change.  HENT:  Negative for congestion, postnasal drip and sore throat.   Eyes: Negative.   Respiratory:  Negative for cough and shortness of  breath.   Cardiovascular:  Negative for chest pain, palpitations and leg swelling.  Gastrointestinal:  Negative for abdominal distention and abdominal pain.  Endocrine: Negative.   Genitourinary: Negative.   Musculoskeletal:  Negative for back pain and neck pain.  Skin: Negative.   Allergic/Immunologic: Negative.   Neurological:  Negative for dizziness and light-headedness.  Hematological:  Negative for adenopathy. Bruises/bleeds easily.  Psychiatric/Behavioral:  Negative for dysphoric mood and sleep disturbance (sleeping on 1 pillow). The patient is not nervous/anxious.    Vitals:   07/21/22 1228  BP: 99/74  Pulse: 73  Resp: 20  SpO2: 98%  Weight: 181 lb (82.1 kg)  Height: 5\' 10"  (1.778 m)   Wt Readings from Last 3 Encounters:  07/21/22 181 lb (82.1 kg)  04/19/22 190 lb (86.2 kg)  03/18/22 191 lb (86.6 kg)   Lab Results  Component Value Date   CREATININE 1.18 04/19/2022   CREATININE 1.35 (H) 03/11/2021   CREATININE 1.29 (H) 02/10/2021   Physical Exam Vitals and nursing note reviewed.  Constitutional:      Appearance: Normal appearance.  HENT:     Head: Normocephalic and atraumatic.  Cardiovascular:     Rate and Rhythm: Normal rate and regular rhythm.  Pulmonary:     Effort: Pulmonary effort is normal. No respiratory distress.     Breath sounds: No wheezing or rales.  Abdominal:     General: Abdomen is flat. There is no distension.     Palpations: Abdomen is soft.  Musculoskeletal:        General: No tenderness.     Cervical back: Normal range of motion and neck supple.     Right lower leg: No edema.     Left lower leg: No edema.  Skin:    General: Skin is warm and dry.  Neurological:     General: No focal deficit present.     Mental Status: He is alert and oriented to person, place, and time.  Psychiatric:        Mood and Affect: Mood normal.        Behavior: Behavior normal.        Thought Content: Thought content normal.   Assessment & Plan:  1:  Chronic heart failure with reduced ejection fraction- - NYHA class II - euvolemic today - weighing daily; reminded to call for an overnight weight gain of > 2 pounds or a weekly weight gain of >5 pounds - weight down 9 pounds from last visit here 3 months ago as he's recently had all his bottom teeth removed and can't eat foods that require a lot of chewing - encouraged him to drink 1 can of ensure or boost daily - not adding salt and is using Mrs. Dash for seasoning; has been reading food labels for sodium content; reviewed the importance of keeping daily sodium intake to ~ 2000mg  / day.  - on GDMT of metoprolol, entresto & spironolactone - current BP  doesn't not allow for SGLT2 - stop spironolactone due to soft BP today - saw EP Wilford Grist) 03/24/21 - AICD implanted 04/16/21  2: CAD- - had 2 stents placed December 2021 - saw cardiology ([facility]) 03/04/22  3: Hypotension- - BP low (99/74) even with taking 1/2 tablet of spironolactone - will stop spironolactone today - BMP 04/19/22 reviewed and showed sodium 139, potassium 3.6, creatinine 1.18 and GFR >60  4: Tobacco use- - says that he hasn't smoked any since his admission December 2021 - congratulated on that and continued cessation encouraged   Medication bottles reviewed.   Return in 2 months, sooner if needed.

## 2022-08-09 IMAGING — DX DG CHEST 1V PORT
1 series · 1 of 1 positions shown · non-contrast
Comparison: 10/08/2011

CLINICAL DATA: Fever beginning today.

EXAM:
PORTABLE CHEST 1 VIEW

[chest ap]
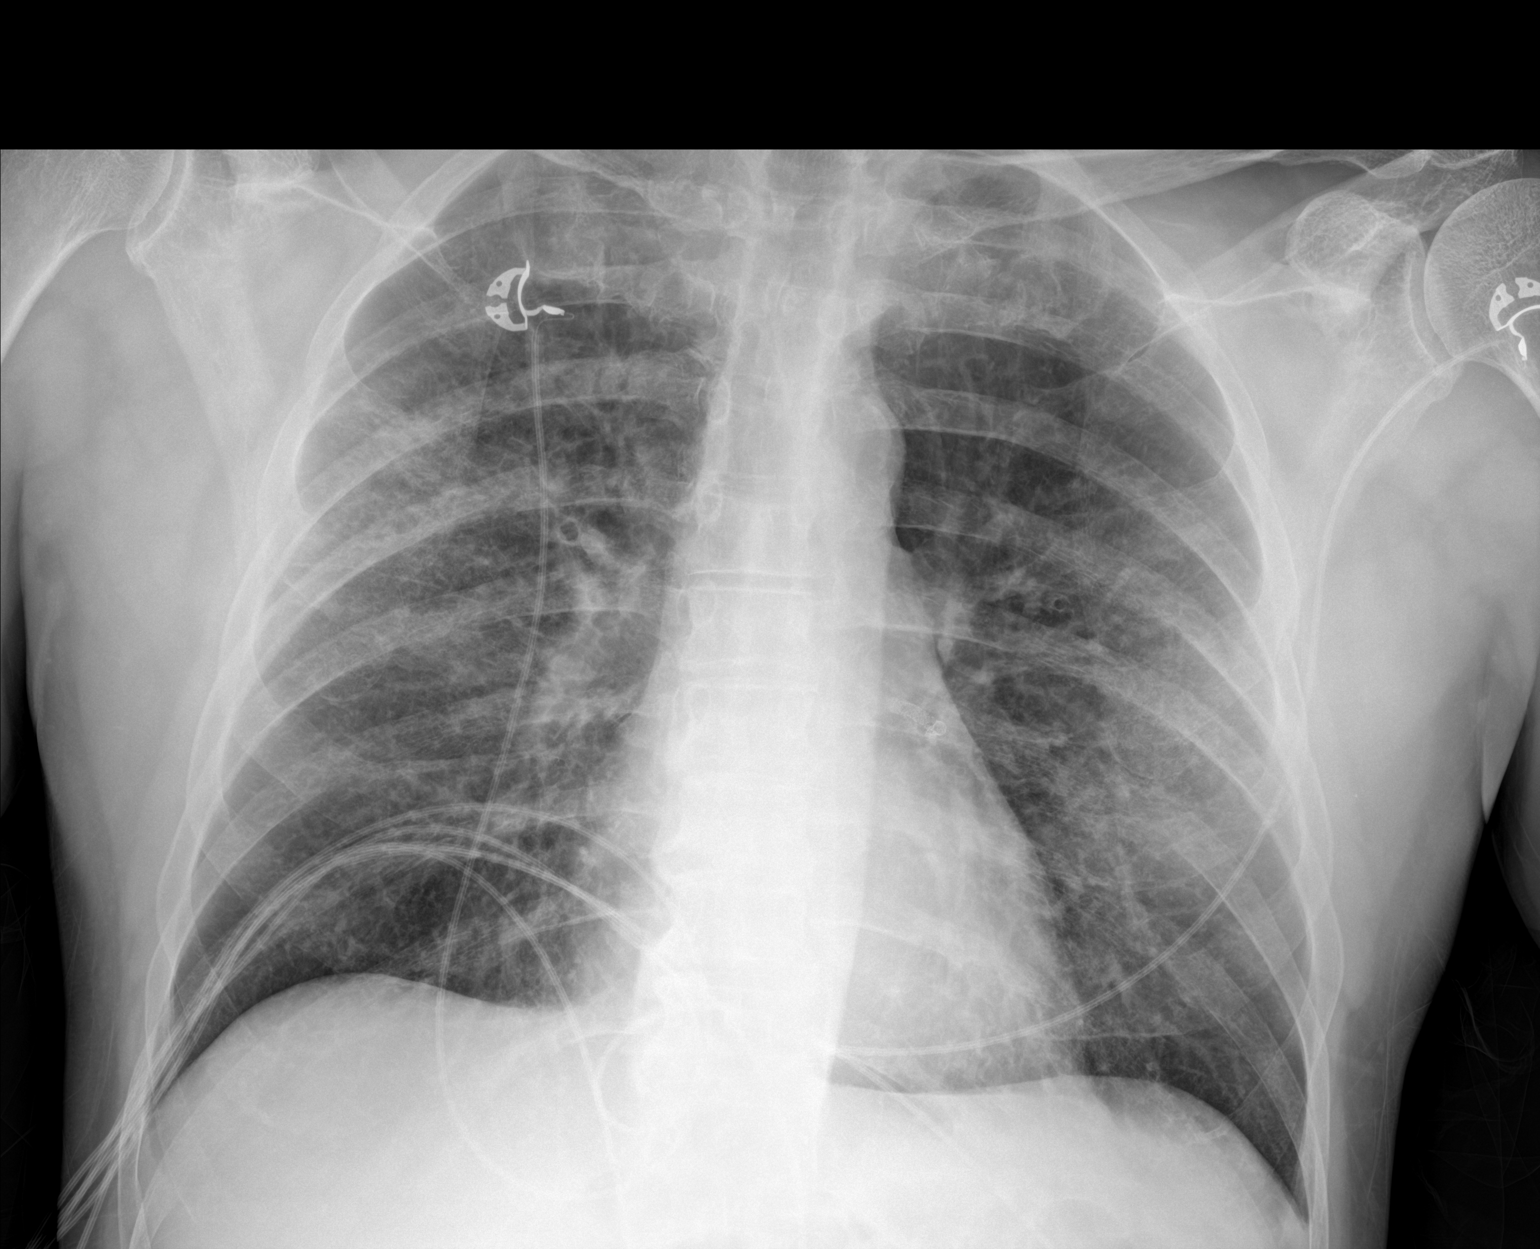

[1 of 1 positions shown; findings below may reference images not displayed]

FINDINGS: Heart size is normal. Mediastinal shadows are normal except for some
aortic atherosclerosis. Hazy pulmonary infiltrates in the mid lungs,
right more than left, consistent with bronchopneumonia. No dense
consolidation, collapse or effusion.
IMPRESSION: Hazy pulmonary infiltrates in the mid lungs, right more than left,
consistent with bronchopneumonia.

## 2022-09-08 ENCOUNTER — Encounter: Payer: Self-pay | Admitting: Family

## 2022-09-08 ENCOUNTER — Ambulatory Visit: Payer: BLUE CROSS/BLUE SHIELD | Attending: Family | Admitting: Family

## 2022-09-08 VITALS — BP 114/78 | HR 67 | Resp 19 | Ht 70.0 in | Wt 189.0 lb

## 2022-09-08 DIAGNOSIS — I952 Hypotension due to drugs: Secondary | ICD-10-CM

## 2022-09-08 DIAGNOSIS — I252 Old myocardial infarction: Secondary | ICD-10-CM | POA: Insufficient documentation

## 2022-09-08 DIAGNOSIS — I5022 Chronic systolic (congestive) heart failure: Secondary | ICD-10-CM | POA: Diagnosis not present

## 2022-09-08 DIAGNOSIS — Z95810 Presence of automatic (implantable) cardiac defibrillator: Secondary | ICD-10-CM | POA: Insufficient documentation

## 2022-09-08 DIAGNOSIS — Z7901 Long term (current) use of anticoagulants: Secondary | ICD-10-CM | POA: Insufficient documentation

## 2022-09-08 DIAGNOSIS — Z7984 Long term (current) use of oral hypoglycemic drugs: Secondary | ICD-10-CM | POA: Insufficient documentation

## 2022-09-08 DIAGNOSIS — I959 Hypotension, unspecified: Secondary | ICD-10-CM | POA: Diagnosis not present

## 2022-09-08 DIAGNOSIS — R5383 Other fatigue: Secondary | ICD-10-CM | POA: Diagnosis present

## 2022-09-08 DIAGNOSIS — I2510 Atherosclerotic heart disease of native coronary artery without angina pectoris: Secondary | ICD-10-CM

## 2022-09-08 DIAGNOSIS — F17210 Nicotine dependence, cigarettes, uncomplicated: Secondary | ICD-10-CM | POA: Diagnosis not present

## 2022-09-08 DIAGNOSIS — Z720 Tobacco use: Secondary | ICD-10-CM

## 2022-09-08 DIAGNOSIS — Z79899 Other long term (current) drug therapy: Secondary | ICD-10-CM | POA: Diagnosis not present

## 2022-09-08 MED ORDER — DAPAGLIFLOZIN PROPANEDIOL 10 MG PO TABS: 10.0000 mg | ORAL_TABLET | Freq: Every day | ORAL | 5 refills | Status: DC

## 2022-09-08 MED ORDER — DAPAGLIFLOZIN PROPANEDIOL 10 MG PO TABS: 10.0000 mg | ORAL_TABLET | Freq: Every day | ORAL | 3 refills | Status: DC

## 2022-09-08 NOTE — Patient Instructions (Addendum)
Continue weighing daily and call for an overnight weight gain of 3 pounds or more or a weekly weight gain of more than 5 pounds.   If you have voicemail, please make sure your mailbox is cleaned out so that we may leave a message and please make sure to listen to any voicemails.    Begin taking farxiga as 1 tablet every morning 

## 2022-09-08 NOTE — Progress Notes (Signed)
Patient ID: Timothy Schultz, male    DOB: 04-15-1961, 61 y.o.   MRN: [id number]   Timothy Schultz is a 61 y/o male with a history of CAD (MI/ stent), recent tobacco use and chronic heart failure.   Echo report from 01/15/21 reviewed and showed an EF of 30-35%. Echo report from 11/08/20 reviewed and showed an EF of 30-35%.  LHC completed 11/07/20 and showed: Prox LAD to Mid LAD lesion is 100% stenosed. 2nd Diag lesion is 70% stenosed. 1st Mrg lesion is 75% stenosed. Prox Cx to Mid Cx lesion is 60% stenosed. A drug-eluting stent was successfully placed using a STENT SYNERGY DES 3X28. Post intervention, there is a 0% residual stenosis. Mid LAD-1 lesion is 70% stenosed. Mid LAD-2 lesion is 40% stenosed. A drug-eluting stent was successfully placed using a STENT SYNERGY DES 2.50X8. Post intervention, there is a 0% residual stenosis. Mid LM lesion is 30% stenosed. Prox RCA lesion is 70% stenosed.   1.  Anterior ST elevation myocardial infarction 2.  Occluded proximal LAD 3.  Preserved left ventricular function 4.  Successful primary PCI with overlapping 3,0 x 28 mm and 2.5 x 8 mm Synergy stents proximal/mid LAD  Has not been admitted or been in the ED in the last 6 months.   He presents today for a follow-up visit with a chief complaint of minimal fatigue upon moderate exertion. Describes this as chronic in nature. Has had a gradual weight gain along with this. Denies any difficulty sleeping, dizziness, abdominal distention, palpitations, pedal edema, chest pain, shortness of breath or cough.   Past Medical History:  Diagnosis Date   CHF (congestive heart failure) (HCC)    Coronary artery disease    Tobacco use    Past Surgical History:  Procedure Laterality Date   CORONARY/GRAFT ACUTE MI REVASCULARIZATION N/A 11/07/2020   Procedure: Coronary/Graft Acute MI Revascularization;  Surgeon: Marcina MillardParaschos, Alexander, MD;  Location: [facility];  Service: Cardiovascular;  Laterality: N/A;    LEFT HEART CATH AND CORONARY ANGIOGRAPHY N/A 11/07/2020   Procedure: LEFT HEART CATH AND CORONARY ANGIOGRAPHY;  Surgeon: Marcina MillardParaschos, Alexander, MD;  Location: [facility];  Service: Cardiovascular;  Laterality: N/A;   No family history on file. Social History   Tobacco Use   Smoking status: Former    Types: Cigarettes    Quit date: 11/29/2020    Years since quitting: 1.7   Smokeless tobacco: Never  Substance Use Topics   Alcohol use: Not Currently   No Known Allergies  Prior to Admission medications   Medication Sig Start Date End Date Taking? Authorizing Provider  aspirin EC 81 MG tablet Take 81 mg by mouth daily. Swallow whole.   Yes [provider]  atorvastatin (LIPITOR) 80 MG tablet Take 1 tablet (80 mg total) by mouth daily. 12/18/21  Yes Uziel Covault A, FNP  buprenorphine (SUBUTEX) 8 MG SUBL SL tablet Place 8 mg under the tongue daily. 10/21/20  Yes [provider]  clopidogrel (PLAVIX) 75 MG tablet Take 75 mg by mouth daily.   Yes [provider]  metoprolol succinate (TOPROL-XL) 25 MG 24 hr tablet Take 1 tablet (25 mg total) by mouth daily. 12/18/21  Yes Jo Cerone A, FNP  sacubitril-valsartan (ENTRESTO) 24-26 MG Take 1 tablet by mouth 2 (two) times daily. 01/15/21  Yes Delma FreezeHackney, Pacey Willadsen A, FNP   Review of Systems  Constitutional:  Positive for fatigue. Negative for appetite change.  HENT:  Negative for congestion, postnasal drip and sore  throat.   Eyes: Negative.   Respiratory:  Negative for cough and shortness of breath.   Cardiovascular:  Negative for chest pain, palpitations and leg swelling.  Gastrointestinal:  Negative for abdominal distention and abdominal pain.  Endocrine: Negative.   Genitourinary: Negative.   Musculoskeletal:  Negative for back pain and neck pain.  Skin: Negative.   Allergic/Immunologic: Negative.   Neurological:  Negative for dizziness and light-headedness.  Hematological:  Negative for adenopathy.  Bruises/bleeds easily.  Psychiatric/Behavioral:  Negative for dysphoric mood and sleep disturbance (sleeping on 1 pillow). The patient is not nervous/anxious.    Vitals:   09/08/22 1338  BP: 114/78  Pulse: 67  Resp: 19  SpO2: 98%  Weight: 189 lb (85.7 kg)  Height: 5\' 10"  (1.778 m)   Wt Readings from Last 3 Encounters:  09/08/22 189 lb (85.7 kg)  07/21/22 181 lb (82.1 kg)  04/19/22 190 lb (86.2 kg)   Lab Results  Component Value Date   CREATININE 1.18 04/19/2022   CREATININE 1.35 (H) 03/11/2021   CREATININE 1.29 (H) 02/10/2021   Physical Exam Vitals and nursing note reviewed.  Constitutional:      Appearance: Normal appearance.  HENT:     Head: Normocephalic and atraumatic.  Cardiovascular:     Rate and Rhythm: Normal rate and regular rhythm.  Pulmonary:     Effort: Pulmonary effort is normal. No respiratory distress.     Breath sounds: No wheezing or rales.  Abdominal:     General: Abdomen is flat. There is no distension.     Palpations: Abdomen is soft.  Musculoskeletal:        General: No tenderness.     Cervical back: Normal range of motion and neck supple.     Right lower leg: No edema.     Left lower leg: No edema.  Skin:    General: Skin is warm and dry.  Neurological:     General: No focal deficit present.     Mental Status: He is alert and oriented to person, place, and time.  Psychiatric:        Mood and Affect: Mood normal.        Behavior: Behavior normal.        Thought Content: Thought content normal.    Assessment & Plan:  1: Chronic heart failure with reduced ejection fraction- - NYHA class II - euvolemic today - weighing daily & notes a gradual weight gain; reminded to call for an overnight weight gain of > 2 pounds or a weekly weight gain of >5 pounds - weight up 8 pounds from last visit here 6 weeks ago  - not adding salt and is using Mrs. Dash for seasoning; has been reading food labels for sodium content; reviewed the importance of  keeping daily sodium intake to ~ 2000mg  / day.  - on GDMT of metoprolol & entresto  - will add farxiga 10mg  daily; patient assistance forms filled out today - check labs at next visit - not taking spironolactone due to previous soft BP's - saw EP Dwana Curd) 03/24/21 - AICD implanted 04/16/21  2: CAD- - had 2 stents placed December 2021 - saw cardiology ([place]) 03/04/22; returns tomorrow  3: Hypotension- - BP looks good (114/78) - BMP 04/19/22 reviewed and showed sodium 139, potassium 3.6, creatinine 1.18 and GFR >60  4: Tobacco use- - says that he hasn't smoked any since his admission December 2021 - congratulated on that and continued cessation encouraged   Medication bottles reviewed.  Return in 1 month, sooner if needed

## 2022-09-09 ENCOUNTER — Encounter: Payer: Self-pay | Admitting: Family

## 2022-09-14 ENCOUNTER — Telehealth: Payer: Self-pay | Admitting: Family

## 2022-09-14 NOTE — Telephone Encounter (Signed)
Patient was denied for [place] patient assistance for Farxiga and notified.      Jhordyn Hoopingarner, NT

## 2022-10-04 ENCOUNTER — Encounter: Payer: Self-pay | Admitting: Family

## 2022-10-05 ENCOUNTER — Other Ambulatory Visit (HOSPITAL_COMMUNITY): Payer: Self-pay

## 2022-10-05 ENCOUNTER — Other Ambulatory Visit: Payer: Self-pay | Admitting: Family

## 2022-10-05 MED ORDER — EMPAGLIFLOZIN 10 MG PO TABS: 10.0000 mg | ORAL_TABLET | Freq: Every day | ORAL | 3 refills | Status: AC

## 2022-10-13 ENCOUNTER — Other Ambulatory Visit
Admission: RE | Admit: 2022-10-13 | Discharge: 2022-10-13 | Disposition: A | Discharge status: Home / Self Care | Payer: BLUE CROSS/BLUE SHIELD | Source: Ambulatory Visit | Attending: Family | Admitting: Family

## 2022-10-13 ENCOUNTER — Ambulatory Visit: Payer: BLUE CROSS/BLUE SHIELD | Admitting: Family

## 2022-10-13 ENCOUNTER — Encounter: Payer: Self-pay | Admitting: Family

## 2022-10-13 ENCOUNTER — Ambulatory Visit (HOSPITAL_BASED_OUTPATIENT_CLINIC_OR_DEPARTMENT_OTHER): Payer: BLUE CROSS/BLUE SHIELD | Admitting: Family

## 2022-10-13 VITALS — BP 128/86 | HR 80 | Resp 20 | Wt 189.0 lb

## 2022-10-13 DIAGNOSIS — Z95810 Presence of automatic (implantable) cardiac defibrillator: Secondary | ICD-10-CM | POA: Insufficient documentation

## 2022-10-13 DIAGNOSIS — I952 Hypotension due to drugs: Secondary | ICD-10-CM

## 2022-10-13 DIAGNOSIS — Z720 Tobacco use: Secondary | ICD-10-CM | POA: Insufficient documentation

## 2022-10-13 DIAGNOSIS — I5022 Chronic systolic (congestive) heart failure: Secondary | ICD-10-CM | POA: Insufficient documentation

## 2022-10-13 DIAGNOSIS — I2510 Atherosclerotic heart disease of native coronary artery without angina pectoris: Secondary | ICD-10-CM | POA: Insufficient documentation

## 2022-10-13 DIAGNOSIS — I959 Hypotension, unspecified: Secondary | ICD-10-CM | POA: Insufficient documentation

## 2022-10-13 LAB — BASIC METABOLIC PANEL
Anion gap: 5 (ref 5–15)
BUN: 9 mg/dL (ref 6–20)
CO2: 26 mmol/L (ref 22–32)
Calcium: 8.5 mg/dL — ABNORMAL LOW (ref 8.9–10.3)
Chloride: 106 mmol/L (ref 98–111)
Creatinine, Ser: 1.15 mg/dL (ref 0.61–1.24)
GFR, Estimated: >60 mL/min (ref >60)
Glucose, Bld: 99 mg/dL (ref 70–99)
Potassium: 3.9 mmol/L (ref 3.5–5.1)
Sodium: 137 mmol/L (ref 135–145)

## 2022-10-13 MED ORDER — SACUBITRIL-VALSARTAN 24-26 MG PO TABS: 1.0000 | ORAL_TABLET | Freq: Two times a day (BID) | ORAL | 3 refills | Status: DC

## 2022-10-13 NOTE — Telephone Encounter (Signed)
Faxed patients re enrollment for [facility] for patient assistance on 11/15 and patient was approved for jardiance through patient assistance according to patient and he is waiting on shipment so we gave him samples at appointment today.   Joelyn Lover, NT

## 2022-10-13 NOTE — Patient Instructions (Signed)
Continue weighing daily and call for an overnight weight gain of 3 pounds or more or a weekly weight gain of more than 5 pounds  If you have voicemail, please make sure your mailbox is cleaned out so that we may leave a message and please make sure to listen to any voicemails.    If you receive a satisfaction survey regarding the [facility], please take the time to fill it out. This way we can continue to provide excellent care and make any changes that need to be made.     

## 2022-10-13 NOTE — Progress Notes (Unsigned)
Patient ID: Timothy Schultz, male    DOB: [birth year], 61 y.o.   MRN: [id number]   Mr Mednick is a 61 y/o male with a history of CAD (MI/ stent), recent tobacco use and chronic heart failure.   Echo report from 01/15/21 reviewed and showed an EF of 30-35%. Echo report from 11/08/20 reviewed and showed an EF of 30-35%.  LHC completed 11/07/20 and showed: Prox LAD to Mid LAD lesion is 100% stenosed. 2nd Diag lesion is 70% stenosed. 1st Mrg lesion is 75% stenosed. Prox Cx to Mid Cx lesion is 60% stenosed. A drug-eluting stent was successfully placed using a STENT SYNERGY DES 3X28. Post intervention, there is a 0% residual stenosis. Mid LAD-1 lesion is 70% stenosed. Mid LAD-2 lesion is 40% stenosed. A drug-eluting stent was successfully placed using a STENT SYNERGY DES 2.50X8. Post intervention, there is a 0% residual stenosis. Mid LM lesion is 30% stenosed. Prox RCA lesion is 70% stenosed.   1.  Anterior ST elevation myocardial infarction 2.  Occluded proximal LAD 3.  Preserved left ventricular function 4.  Successful primary PCI with overlapping 3,0 x 28 mm and 2.5 x 8 mm Synergy stents proximal/mid LAD  Has not been admitted or been in the ED in the last 6 months.   He presents today for a follow-up visit with a chief complaint of minimal fatigue upon moderate exertion. Describes this as chronic in nature. He has associated easy bruising along with this. Denies any difficulty sleeping, dizziness, abdominal distention, palpitations, pedal edema, chest pain, shortness of breath, cough or weight gain.   Has tolerated jardiance without known side effects and says that he's recently gotten a letter saying that he was approved for patient assistance for jardiance.   Past Medical History:  Diagnosis Date   CHF (congestive heart failure) (HCC)    Coronary artery disease    Tobacco use    Past Surgical History:  Procedure Laterality Date   CORONARY/GRAFT ACUTE MI REVASCULARIZATION N/A  11/07/2020   Procedure: Coronary/Graft Acute MI Revascularization;  Surgeon: Isaias Cowman, MD;  Location: [place] [facility];  Service: Cardiovascular;  Laterality: N/A;   LEFT HEART CATH AND CORONARY ANGIOGRAPHY N/A 11/07/2020   Procedure: LEFT HEART CATH AND CORONARY ANGIOGRAPHY;  Surgeon: Isaias Cowman, MD;  Location: [place] [facility];  Service: Cardiovascular;  Laterality: N/A;   No family history on file. Social History   Tobacco Use   Smoking status: Former    Types: Cigarettes    Quit date: 11/29/2020    Years since quitting: 1.8   Smokeless tobacco: Never  Substance Use Topics   Alcohol use: Not Currently   No Known Allergies  Prior to Admission medications   Medication Sig Start Date End Date Taking? Authorizing Provider  aspirin EC 81 MG tablet Take 81 mg by mouth daily. Swallow whole.   Yes [provider]  atorvastatin (LIPITOR) 80 MG tablet Take 1 tablet (80 mg total) by mouth daily. 12/18/21  Yes Marquon Alcala A, FNP  buprenorphine (SUBUTEX) 8 MG SUBL SL tablet Place 8 mg under the tongue daily. 10/21/20  Yes [provider]  clopidogrel (PLAVIX) 75 MG tablet Take 75 mg by mouth daily.   Yes [provider]  empagliflozin (JARDIANCE) 10 MG TABS tablet Take 1 tablet (10 mg total) by mouth daily before breakfast. 10/05/22  Yes Darylene Price A, FNP  metoprolol succinate (TOPROL-XL) 25 MG 24 hr tablet Take 1 tablet (25 mg total) by mouth daily.  12/18/21  Yes Aldonia Keeven A, FNP  sacubitril-valsartan (ENTRESTO) 24-26 MG Take 1 tablet by mouth 2 (two) times daily. 10/13/22   Delma Freeze, FNP   Review of Systems  Constitutional:  Positive for fatigue. Negative for appetite change.  HENT:  Negative for congestion, postnasal drip and sore throat.   Eyes: Negative.   Respiratory:  Negative for cough and shortness of breath.   Cardiovascular:  Negative for chest pain, palpitations and leg swelling.  Gastrointestinal:  Negative for  abdominal distention and abdominal pain.  Endocrine: Negative.   Genitourinary: Negative.   Musculoskeletal:  Negative for back pain and neck pain.  Skin: Negative.   Allergic/Immunologic: Negative.   Neurological:  Negative for dizziness and light-headedness.  Hematological:  Negative for adenopathy. Bruises/bleeds easily.  Psychiatric/Behavioral:  Negative for dysphoric mood and sleep disturbance (sleeping on 1 pillow). The patient is not nervous/anxious.    Vitals:   10/13/22 1408  BP: 128/86  Pulse: 80  Resp: 20  SpO2: 99%  Weight: 189 lb (85.7 kg)   Wt Readings from Last 3 Encounters:  10/13/22 189 lb (85.7 kg)  09/08/22 189 lb (85.7 kg)  07/21/22 181 lb (82.1 kg)   Lab Results  Component Value Date   CREATININE 1.15 10/13/2022   CREATININE 1.18 04/19/2022   CREATININE 1.35 (H) 03/11/2021   Physical Exam Vitals and nursing note reviewed.  Constitutional:      Appearance: Normal appearance.  HENT:     Head: Normocephalic and atraumatic.  Cardiovascular:     Rate and Rhythm: Normal rate and regular rhythm.  Pulmonary:     Effort: Pulmonary effort is normal. No respiratory distress.     Breath sounds: No wheezing or rales.  Abdominal:     General: Abdomen is flat. There is no distension.     Palpations: Abdomen is soft.  Musculoskeletal:        General: No tenderness.     Cervical back: Normal range of motion and neck supple.     Right lower leg: No edema.     Left lower leg: No edema.  Skin:    General: Skin is warm and dry.  Neurological:     General: No focal deficit present.     Mental Status: He is alert and oriented to person, place, and time.  Psychiatric:        Mood and Affect: Mood normal.        Behavior: Behavior normal.        Thought Content: Thought content normal.    Assessment & Plan:  1: Chronic heart failure with reduced ejection fraction- - NYHA class II - euvolemic today - weighing daily; reminded to call for an overnight weight  gain of > 2 pounds or a weekly weight gain of >5 pounds - weight unchanged from last visit here 1 month ago  - not adding salt and is using Mrs. Dash for seasoning; has been reading food labels for sodium content; reviewed the importance of keeping daily sodium intake to ~ 2000mg  / day.  - on GDMT of metoprolol, entresto & jardiance - check BMP today since jardiance was added at last visit - not taking spironolactone due to previous soft BP's; consider trying again if BP remains good - saw EP ) 03/24/21 - AICD implanted 04/16/21  2: CAD- - had 2 stents placed December 2021 - saw cardiology ([place]) 09/09/22  3: Hypotension- - BP looks good (128/86) - BMP 04/19/22 reviewed and showed sodium 139,  potassium 3.6, creatinine 1.18 and GFR >60  4: Tobacco use- - says that he hasn't smoked any since his admission December 2021 - congratulated on that and continued cessation encouraged   Medication list reviewed.   Return in 3 months, sooner if needed.

## 2022-10-14 ENCOUNTER — Encounter: Payer: Self-pay | Admitting: Family

## 2022-10-28 NOTE — Telephone Encounter (Signed)
Patient approved for jardiance patient assistance until 10/06/23.   Dash Cardarelli, NT

## 2022-11-01 ENCOUNTER — Ambulatory Visit: Payer: BLUE CROSS/BLUE SHIELD | Admitting: Family

## 2022-12-21 ENCOUNTER — Other Ambulatory Visit: Payer: Self-pay | Admitting: Family

## 2022-12-21 MED ORDER — ATORVASTATIN CALCIUM 80 MG PO TABS: 80.0000 mg | ORAL_TABLET | Freq: Every day | ORAL | 3 refills | Status: DC

## 2023-01-13 ENCOUNTER — Ambulatory Visit: Payer: BLUE CROSS/BLUE SHIELD | Attending: Family | Admitting: Family

## 2023-01-13 ENCOUNTER — Encounter: Payer: Self-pay | Admitting: Family

## 2023-01-13 VITALS — BP 118/90 | HR 92 | Resp 20 | Wt 197.0 lb

## 2023-01-13 DIAGNOSIS — Z7984 Long term (current) use of oral hypoglycemic drugs: Secondary | ICD-10-CM | POA: Insufficient documentation

## 2023-01-13 DIAGNOSIS — I2510 Atherosclerotic heart disease of native coronary artery without angina pectoris: Secondary | ICD-10-CM | POA: Diagnosis not present

## 2023-01-13 DIAGNOSIS — Z95810 Presence of automatic (implantable) cardiac defibrillator: Secondary | ICD-10-CM | POA: Diagnosis not present

## 2023-01-13 DIAGNOSIS — I252 Old myocardial infarction: Secondary | ICD-10-CM | POA: Insufficient documentation

## 2023-01-13 DIAGNOSIS — Z87891 Personal history of nicotine dependence: Secondary | ICD-10-CM | POA: Insufficient documentation

## 2023-01-13 DIAGNOSIS — Z79899 Other long term (current) drug therapy: Secondary | ICD-10-CM | POA: Insufficient documentation

## 2023-01-13 DIAGNOSIS — I952 Hypotension due to drugs: Secondary | ICD-10-CM

## 2023-01-13 DIAGNOSIS — I5022 Chronic systolic (congestive) heart failure: Secondary | ICD-10-CM

## 2023-01-13 DIAGNOSIS — Z720 Tobacco use: Secondary | ICD-10-CM | POA: Diagnosis not present

## 2023-01-13 DIAGNOSIS — I959 Hypotension, unspecified: Secondary | ICD-10-CM | POA: Diagnosis not present

## 2023-01-13 NOTE — Progress Notes (Signed)
Patient ID: Timothy Schultz, male    DOB: 1961-07-24, 62 y.o.   MRN: [id number]   Timothy Schultz is a 62 y/o male with a history of CAD (MI/ stent), recent tobacco use and chronic heart failure.   Echo report from 01/15/21 reviewed and showed an EF of 30-35%. Echo report from 11/08/20 reviewed and showed an EF of 30-35%.  LHC completed 11/07/20 and showed: Prox LAD to Mid LAD lesion is 100% stenosed. 2nd Diag lesion is 70% stenosed. 1st Mrg lesion is 75% stenosed. Prox Cx to Mid Cx lesion is 60% stenosed. A drug-eluting stent was successfully placed using a STENT SYNERGY DES 3X28. Post intervention, there is a 0% residual stenosis. Mid LAD-1 lesion is 70% stenosed. Mid LAD-2 lesion is 40% stenosed. A drug-eluting stent was successfully placed using a STENT SYNERGY DES 2.50X8. Post intervention, there is a 0% residual stenosis. Mid LM lesion is 30% stenosed. Prox RCA lesion is 70% stenosed.   1.  Anterior ST elevation myocardial infarction 2.  Occluded proximal LAD 3.  Preserved left ventricular function 4.  Successful primary PCI with overlapping 3,0 x 28 mm and 2.5 x 8 mm Synergy stents proximal/mid LAD  Has not been admitted or been in the ED in the last 6 months.   He presents today for a HF follow-up visit with no complaints. Specifically denies any difficulty sleeping, dizziness, abdominal distention, palpitations, pedal edema, chest pain, SOB, cough, fatigue or weight gain.   Overall he says that he feels "great"  Past Medical History:  Diagnosis Date   CHF (congestive heart failure) (HCC)    Coronary artery disease    Tobacco use    Past Surgical History:  Procedure Laterality Date   CORONARY/GRAFT ACUTE MI REVASCULARIZATION N/A 11/07/2020   Procedure: Coronary/Graft Acute MI Revascularization;  Surgeon: Isaias Cowman, MD;  Location: [facility];  Service: Cardiovascular;  Laterality: N/A;   LEFT HEART CATH AND CORONARY ANGIOGRAPHY N/A 11/07/2020    Procedure: LEFT HEART CATH AND CORONARY ANGIOGRAPHY;  Surgeon: Isaias Cowman, MD;  Location: [facility];  Service: Cardiovascular;  Laterality: N/A;   No family history on file. Social History   Tobacco Use   Smoking status: Former    Types: Cigarettes    Quit date: 11/29/2020    Years since quitting: 2.1   Smokeless tobacco: Never  Substance Use Topics   Alcohol use: Not Currently   No Known Allergies  Prior to Admission medications   Medication Sig Start Date End Date Taking? Authorizing Provider  aspirin EC 81 MG tablet Take 81 mg by mouth daily. Swallow whole.   Yes [provider]  atorvastatin (LIPITOR) 80 MG tablet Take 1 tablet (80 mg total) by mouth daily. 12/21/22  Yes Kimm Ungaro, Otila Kluver A, FNP  buprenorphine (SUBUTEX) 8 MG SUBL SL tablet Place 8 mg under the tongue daily. 10/21/20  Yes [provider]  clopidogrel (PLAVIX) 75 MG tablet Take 75 mg by mouth daily.   Yes [provider]  empagliflozin (JARDIANCE) 10 MG TABS tablet Take 1 tablet (10 mg total) by mouth daily before breakfast. 10/05/22  Yes Darylene Price A, FNP  metoprolol succinate (TOPROL-XL) 25 MG 24 hr tablet Take 1 tablet (25 mg total) by mouth daily. 12/18/21  Yes Kylen Schliep A, FNP  sacubitril-valsartan (ENTRESTO) 24-26 MG Take 1 tablet by mouth 2 (two) times daily. 10/13/22  Yes Alisa Graff, FNP   Review of Systems  Constitutional:  Negative for appetite  change and fatigue.  HENT:  Negative for congestion, postnasal drip and sore throat.   Eyes: Negative.   Respiratory:  Negative for cough and shortness of breath.   Cardiovascular:  Negative for chest pain, palpitations and leg swelling.  Gastrointestinal:  Negative for abdominal distention and abdominal pain.  Endocrine: Negative.   Genitourinary: Negative.   Musculoskeletal:  Negative for back pain and neck pain.  Skin: Negative.   Allergic/Immunologic: Negative.   Neurological:  Negative for dizziness and  light-headedness.  Hematological:  Negative for adenopathy. Bruises/bleeds easily.  Psychiatric/Behavioral:  Negative for dysphoric mood and sleep disturbance (sleeping on 1 pillow). The patient is not nervous/anxious.    Vitals:   01/13/23 1242  BP: (!) 118/90  Pulse: 92  Resp: 20  SpO2: 100%  Weight: 197 lb (89.4 kg)   Wt Readings from Last 3 Encounters:  01/13/23 197 lb (89.4 kg)  10/13/22 189 lb (85.7 kg)  09/08/22 189 lb (85.7 kg)   Lab Results  Component Value Date   CREATININE 1.15 10/13/2022   CREATININE 1.18 04/19/2022   CREATININE 1.35 (H) 03/11/2021   Physical Exam Vitals and nursing note reviewed.  Constitutional:      Appearance: Normal appearance.  HENT:     Head: Normocephalic and atraumatic.  Cardiovascular:     Rate and Rhythm: Normal rate and regular rhythm.  Pulmonary:     Effort: Pulmonary effort is normal. No respiratory distress.     Breath sounds: No wheezing or rales.  Abdominal:     General: Abdomen is flat. There is no distension.     Palpations: Abdomen is soft.  Musculoskeletal:        General: No tenderness.     Cervical back: Normal range of motion and neck supple.     Right lower leg: No edema.     Left lower leg: No edema.  Skin:    General: Skin is warm and dry.  Neurological:     General: No focal deficit present.     Mental Status: He is alert and oriented to person, place, and time.  Psychiatric:        Mood and Affect: Mood normal.        Behavior: Behavior normal.        Thought Content: Thought content normal.   Assessment & Plan:  1: Chronic heart failure with reduced ejection fraction- - NYHA class II - euvolemic today - weighing daily; reminded to call for an overnight weight gain of > 2 pounds or a weekly weight gain of >5 pounds - weight up 8 pounds from last visit here 3 months ago  - not adding salt and is using Mrs. Dash for seasoning; has been reading food labels for sodium content; reviewed the importance of  keeping daily sodium intake to ~ 2072m / day.  - on GDMT of metoprolol, entresto & jardiance - not taking spironolactone due to previous soft BP's; consider trying again if BP remains good - discussed possibly titrating up metoprolol or entresto but will get echo updated first - saw EP (Dwana Curd 03/24/21 - AICD implanted 04/16/21  2: CAD- - had 2 stents placed December 2021 - [place] cardiology ([facility] 09/09/22  3: Hypotension- - BP 118/90 - BMP 10/13/22 reviewed and showed sodium 137, potassium 3.9, creatinine 1.15 and GFR >60  4: Tobacco use- - says that he hasn't smoked any since his admission December 2021 - congratulated on that and continued cessation encouraged   Medication bottles reviewed.  Return in 1 month, sooner if needed.

## 2023-01-13 NOTE — Patient Instructions (Signed)
Continue weighing daily and call for an overnight weight gain of 3 pounds or more or a weekly weight gain of more than 5 pounds. ? ?

## 2023-02-04 ENCOUNTER — Ambulatory Visit
Admission: RE | Admit: 2023-02-04 | Discharge: 2023-02-04 | Disposition: A | Discharge status: Home / Self Care | Payer: BLUE CROSS/BLUE SHIELD | Source: Ambulatory Visit | Attending: Family | Admitting: Family

## 2023-02-04 DIAGNOSIS — I2510 Atherosclerotic heart disease of native coronary artery without angina pectoris: Secondary | ICD-10-CM | POA: Diagnosis not present

## 2023-02-04 DIAGNOSIS — I081 Rheumatic disorders of both mitral and tricuspid valves: Secondary | ICD-10-CM | POA: Diagnosis not present

## 2023-02-04 DIAGNOSIS — I5022 Chronic systolic (congestive) heart failure: Secondary | ICD-10-CM | POA: Insufficient documentation

## 2023-02-04 DIAGNOSIS — Z87891 Personal history of nicotine dependence: Secondary | ICD-10-CM | POA: Diagnosis not present

## 2023-02-04 DIAGNOSIS — I252 Old myocardial infarction: Secondary | ICD-10-CM | POA: Diagnosis not present

## 2023-02-04 LAB — ECHOCARDIOGRAM COMPLETE
AR max vel: 2.67 cm2
AV Area VTI: 2.53 cm2
AV Area mean vel: 2.51 cm2
AV Mean grad: 3 mmHg
AV Peak grad: 4.9 mmHg
Ao pk vel: 1.11 m/s
Area-P 1/2: 3.7 cm2
MV VTI: 2.16 cm2
S' Lateral: 4.1 cm

## 2023-02-04 NOTE — Progress Notes (Signed)
*  PRELIMINARY RESULTS* Echocardiogram 2D Echocardiogram has been performed.  Timothy Schultz 02/04/2023, 12:16 PM

## 2023-02-15 ENCOUNTER — Encounter: Payer: BLUE CROSS/BLUE SHIELD | Admitting: Family

## 2023-02-18 ENCOUNTER — Ambulatory Visit (HOSPITAL_BASED_OUTPATIENT_CLINIC_OR_DEPARTMENT_OTHER): Payer: BLUE CROSS/BLUE SHIELD | Admitting: Family

## 2023-02-18 ENCOUNTER — Encounter: Payer: Self-pay | Admitting: Family

## 2023-02-18 ENCOUNTER — Other Ambulatory Visit
Admission: RE | Admit: 2023-02-18 | Discharge: 2023-02-18 | Disposition: A | Discharge status: Home / Self Care | Payer: BLUE CROSS/BLUE SHIELD | Source: Ambulatory Visit | Attending: Family | Admitting: Family

## 2023-02-18 VITALS — BP 120/80 | HR 60 | Wt 200.2 lb

## 2023-02-18 DIAGNOSIS — Z95810 Presence of automatic (implantable) cardiac defibrillator: Secondary | ICD-10-CM | POA: Insufficient documentation

## 2023-02-18 DIAGNOSIS — Z7984 Long term (current) use of oral hypoglycemic drugs: Secondary | ICD-10-CM | POA: Insufficient documentation

## 2023-02-18 DIAGNOSIS — Z79899 Other long term (current) drug therapy: Secondary | ICD-10-CM | POA: Insufficient documentation

## 2023-02-18 DIAGNOSIS — Z720 Tobacco use: Secondary | ICD-10-CM

## 2023-02-18 DIAGNOSIS — I5022 Chronic systolic (congestive) heart failure: Secondary | ICD-10-CM | POA: Insufficient documentation

## 2023-02-18 DIAGNOSIS — I2510 Atherosclerotic heart disease of native coronary artery without angina pectoris: Secondary | ICD-10-CM | POA: Diagnosis not present

## 2023-02-18 DIAGNOSIS — Z7902 Long term (current) use of antithrombotics/antiplatelets: Secondary | ICD-10-CM | POA: Insufficient documentation

## 2023-02-18 DIAGNOSIS — Z955 Presence of coronary angioplasty implant and graft: Secondary | ICD-10-CM | POA: Insufficient documentation

## 2023-02-18 DIAGNOSIS — I252 Old myocardial infarction: Secondary | ICD-10-CM | POA: Insufficient documentation

## 2023-02-18 DIAGNOSIS — I952 Hypotension due to drugs: Secondary | ICD-10-CM | POA: Diagnosis not present

## 2023-02-18 LAB — BASIC METABOLIC PANEL
Anion gap: 7 (ref 5–15)
BUN: 10 mg/dL (ref 6–20)
CO2: 26 mmol/L (ref 22–32)
Calcium: 8.5 mg/dL — ABNORMAL LOW (ref 8.9–10.3)
Chloride: 106 mmol/L (ref 98–111)
Creatinine, Ser: 1.15 mg/dL (ref 0.61–1.24)
GFR, Estimated: >60 mL/min (ref >60)
Glucose, Bld: 120 mg/dL — ABNORMAL HIGH (ref 70–99)
Potassium: 3.6 mmol/L (ref 3.5–5.1)
Sodium: 139 mmol/L (ref 135–145)

## 2023-02-18 MED ORDER — SPIRONOLACTONE 25 MG PO TABS: 12.5000 mg | ORAL_TABLET | Freq: Every day | ORAL | 3 refills | Status: DC

## 2023-02-18 NOTE — Addendum Note (Signed)
Addended by: Darylene Price A on: 02/18/2023 03:16 PM   Modules accepted: Orders

## 2023-02-18 NOTE — Progress Notes (Signed)
Patient ID: Timothy Schultz, male    DOB: 23-Dec-1960, 62 y.o.   MRN: [id number]   Mr Rashed is a 62 y/o male with a history of CAD (MI/ stent), recent tobacco use and chronic heart failure.   Echo 02/04/23: EF 30-35% with mild MR. Echo 01/15/21: EF of 30-35%. Echo 11/08/20: EF of 30-35%.  LHC completed 11/07/20 and showed: Prox LAD to Mid LAD lesion is 100% stenosed. 2nd Diag lesion is 70% stenosed. 1st Mrg lesion is 75% stenosed. Prox Cx to Mid Cx lesion is 60% stenosed. A drug-eluting stent was successfully placed using a STENT SYNERGY DES 3X28. Post intervention, there is a 0% residual stenosis. Mid LAD-1 lesion is 70% stenosed. Mid LAD-2 lesion is 40% stenosed. A drug-eluting stent was successfully placed using a STENT SYNERGY DES 2.50X8. Post intervention, there is a 0% residual stenosis. Mid LM lesion is 30% stenosed. Prox RCA lesion is 70% stenosed.  1.  Anterior ST elevation myocardial infarction 2.  Occluded proximal LAD 3.  Preserved left ventricular function 4.  Successful primary PCI with overlapping 3,0 x 28 mm and 2.5 x 8 mm Synergy stents proximal/mid LAD  Has not been admitted or been in the ED in the last 6 months.   He presents today for a HF follow-up visit with no complaints. Currently denies any difficulty sleeping, dizziness, abdominal distention, SOB, palpitations, chest pain, pedal edema or fatigue.   Says that his weight continues to slowly rise ever since he quit smoking 2021.  Past Medical History:  Diagnosis Date   CHF (congestive heart failure) (HCC)    Coronary artery disease    Tobacco use    Past Surgical History:  Procedure Laterality Date   CORONARY/GRAFT ACUTE MI REVASCULARIZATION N/A 11/07/2020   Procedure: Coronary/Graft Acute MI Revascularization;  Surgeon: Isaias Cowman, MD;  Location: [facility];  Service: Cardiovascular;  Laterality: N/A;   LEFT HEART CATH AND CORONARY ANGIOGRAPHY N/A 11/07/2020   Procedure: LEFT HEART  CATH AND CORONARY ANGIOGRAPHY;  Surgeon: Isaias Cowman, MD;  Location: [facility];  Service: Cardiovascular;  Laterality: N/A;   No family history on file. Social History   Tobacco Use   Smoking status: Former    Types: Cigarettes    Quit date: 11/29/2020    Years since quitting: 2.2   Smokeless tobacco: Never  Substance Use Topics   Alcohol use: Not Currently   No Known Allergies  Prior to Admission medications   Medication Sig Start Date End Date Taking? Authorizing Provider  aspirin EC 81 MG tablet Take 81 mg by mouth daily. Swallow whole.   Yes [provider]  atorvastatin (LIPITOR) 80 MG tablet Take 1 tablet (80 mg total) by mouth daily. 12/21/22  Yes Braeden Dolinski, Otila Kluver A, FNP  buprenorphine (SUBUTEX) 8 MG SUBL SL tablet Place 8 mg under the tongue daily. 10/21/20  Yes [provider]  clopidogrel (PLAVIX) 75 MG tablet Take 75 mg by mouth daily.   Yes [provider]  empagliflozin (JARDIANCE) 10 MG TABS tablet Take 1 tablet (10 mg total) by mouth daily before breakfast. 10/05/22  Yes Darylene Price A, FNP  metoprolol succinate (TOPROL-XL) 25 MG 24 hr tablet Take 1 tablet (25 mg total) by mouth daily. 12/18/21  Yes Serai Tukes A, FNP  sacubitril-valsartan (ENTRESTO) 24-26 MG Take 1 tablet by mouth 2 (two) times daily. 10/13/22  Yes Darylene Price A, FNP  spironolactone (ALDACTONE) 25 MG tablet Take 0.5 tablets (12.5 mg total) by  mouth daily. 02/18/23  Yes Alisa Graff, FNP    Review of Systems  Constitutional:  Negative for appetite change and fatigue.  HENT:  Negative for congestion, postnasal drip and sore throat.   Eyes: Negative.   Respiratory:  Negative for cough and shortness of breath.   Cardiovascular:  Negative for chest pain, palpitations and leg swelling.  Gastrointestinal:  Negative for abdominal distention and abdominal pain.  Endocrine: Negative.   Genitourinary: Negative.   Musculoskeletal:  Negative for back pain and neck  pain.  Skin: Negative.   Allergic/Immunologic: Negative.   Neurological:  Negative for dizziness and light-headedness.  Hematological:  Negative for adenopathy. Bruises/bleeds easily.  Psychiatric/Behavioral:  Negative for dysphoric mood and sleep disturbance (sleeping on 1 pillow). The patient is not nervous/anxious.    Vitals:   02/18/23 1307  BP: 120/80  Pulse: 60  SpO2: 98%  Weight: 200 lb 4 oz (90.8 kg)   Wt Readings from Last 3 Encounters:  02/18/23 200 lb 4 oz (90.8 kg)  01/13/23 197 lb (89.4 kg)  10/13/22 189 lb (85.7 kg)   Lab Results  Component Value Date   CREATININE 1.15 10/13/2022   CREATININE 1.18 04/19/2022   CREATININE 1.35 (H) 03/11/2021   Physical Exam Vitals and nursing note reviewed.  Constitutional:      Appearance: Normal appearance.  HENT:     Head: Normocephalic and atraumatic.  Cardiovascular:     Rate and Rhythm: Normal rate and regular rhythm.  Pulmonary:     Effort: Pulmonary effort is normal. No respiratory distress.     Breath sounds: No wheezing or rales.  Abdominal:     General: Abdomen is flat. There is no distension.     Palpations: Abdomen is soft.  Musculoskeletal:        General: No tenderness.     Cervical back: Normal range of motion and neck supple.     Right lower leg: No edema.     Left lower leg: No edema.  Skin:    General: Skin is warm and dry.  Neurological:     General: No focal deficit present.     Mental Status: He is alert and oriented to person, place, and time.  Psychiatric:        Mood and Affect: Mood normal.        Behavior: Behavior normal.        Thought Content: Thought content normal.   Assessment & Plan:  1: Ischemic Chronic heart failure with reduced ejection fraction- - NYHA class I - euvolemic today - weighing daily; reminded to call for an overnight weight gain of > 2 pounds or a weekly weight gain of >5 pounds - weight up 3 pounds from last visit here 1 month ago  - echo 02/04/23: EF 30-35%  with mild MR. Echo 01/15/21: EF of 30-35%. Echo 11/08/20: EF of 30-35%. -LHC completed 11/07/20 and showed: Prox LAD to Mid LAD lesion is 100% stenosed. 2nd Diag lesion is 70% stenosed. 1st Mrg lesion is 75% stenosed. Prox Cx to Mid Cx lesion is 60% stenosed. A drug-eluting stent was successfully placed using a STENT SYNERGY DES 3X28. Post intervention, there is a 0% residual stenosis. Mid LAD-1 lesion is 70% stenosed. Mid LAD-2 lesion is 40% stenosed. A drug-eluting stent was successfully placed using a STENT SYNERGY DES 2.50X8. Post intervention, there is a 0% residual stenosis. Mid LM lesion is 30% stenosed. Prox RCA lesion is 70% stenosed.  1.  Anterior ST elevation myocardial  infarction 2.  Occluded proximal LAD 3.  Preserved left ventricular function 4.  Successful primary PCI with overlapping 3,0 x 28 mm and 2.5 x 8 mm Synergy stents proximal/mid LAD - not adding salt and is using Mrs. Dash for seasoning; has been reading food labels for sodium content; reviewed the importance of keeping daily sodium intake to ~ 2000mg  / day.  - metoprolol succinate 25mg  daily - jardiance 10mg  daily - entresto 24/26mg  BID; discussed titrating this at next visit - begin 12.5mg  spironolactone daily; tried previously at 25mg  and developed hypotension - advised to check BP daily or QOD and let us know if the SBP gets to 100 - BMP today and then again next week.  - since EF is not improving, will refer to [facility] provider; this has been scheduled for 03/04/23 - saw EP Dwana Curd) 03/24/21 - AICD implanted 04/16/21  2: CAD- - had 2 stents placed December 2021 - atorvastatin 80mg  daily - clopidogrel 75mg  daily - saw cardiology (Paraschos) 09/09/22  3: Hypotension- - BP 120/80 - BMP 10/13/22 reviewed and showed sodium 137, potassium 3.9, creatinine 1.15 and GFR >60  4: Tobacco use- - says that he hasn't smoked any since his admission December 2021 - congratulated on that and continued cessation  encouraged   Return in 2 weeks, sooner if needed.

## 2023-02-18 NOTE — Patient Instructions (Signed)
Start spironolactone as 1/2 tablet every day.    Go to the [facility] today and then again in a week to 10 days for your labs. When you come the next time for labs, you will need to go to [facility]

## 2023-03-04 ENCOUNTER — Encounter: Payer: Self-pay | Admitting: Internal Medicine

## 2023-03-04 ENCOUNTER — Ambulatory Visit: Payer: BLUE CROSS/BLUE SHIELD | Attending: Internal Medicine | Admitting: Internal Medicine

## 2023-03-04 VITALS — BP 110/72 | HR 80 | Wt 200.4 lb

## 2023-03-04 DIAGNOSIS — Z955 Presence of coronary angioplasty implant and graft: Secondary | ICD-10-CM | POA: Diagnosis not present

## 2023-03-04 DIAGNOSIS — Z7984 Long term (current) use of oral hypoglycemic drugs: Secondary | ICD-10-CM | POA: Diagnosis not present

## 2023-03-04 DIAGNOSIS — I5022 Chronic systolic (congestive) heart failure: Secondary | ICD-10-CM | POA: Insufficient documentation

## 2023-03-04 DIAGNOSIS — I252 Old myocardial infarction: Secondary | ICD-10-CM | POA: Insufficient documentation

## 2023-03-04 DIAGNOSIS — I2510 Atherosclerotic heart disease of native coronary artery without angina pectoris: Secondary | ICD-10-CM | POA: Insufficient documentation

## 2023-03-04 DIAGNOSIS — Z79899 Other long term (current) drug therapy: Secondary | ICD-10-CM | POA: Diagnosis not present

## 2023-03-04 DIAGNOSIS — J449 Chronic obstructive pulmonary disease, unspecified: Secondary | ICD-10-CM | POA: Diagnosis not present

## 2023-03-04 DIAGNOSIS — Z87891 Personal history of nicotine dependence: Secondary | ICD-10-CM | POA: Insufficient documentation

## 2023-03-04 MED ORDER — METOPROLOL SUCCINATE ER 25 MG PO TB24
25.0000 mg | ORAL_TABLET | Freq: Two times a day (BID) | ORAL | 3 refills | Status: AC
Start: 2023-03-04 — End: ?

## 2023-03-04 NOTE — Patient Instructions (Signed)
INCREASE Toprol XL 25mg  twice daily

## 2023-03-04 NOTE — Progress Notes (Unsigned)
ADVANCED HF CLINIC CONSULT NOTE  Referring Physician: Corrington, Meredith Mody, MD Primary Care: Corrington, Meredith Mody, MD Primary Cardiologist: Dr. Darrold Junker (last visit 10/23)  HPI:  Mr Willinger is a 62 y/o male with a history of CAD (MI/ stent),COPD and chronic systolic heart failure. He is followed by Dr. Darrold Junker. Referred by Clarisa Kindred, NP for further evaluation of his HF.   Anterior STEMI 12/21:  LM: 30% LAD prox occlusion -> DES x 2, D2 70% LCx: 60%p, OM-1 75% RCA: 70%p   Echo 11/08/20: EF of 30-35%. Echo 02/04/23: EF 30-35% with mild MR.   BosSCi ICD in 5/22   Has been followed in HF Clinic and meds titrated. Overall doing pretty well. Works in Landscape [employer]. Paces himself. No CP or SOB. No edema, orthopnea or PND. Feels normal. Was getting low BPs but meds adjusted and no longer happening. No ICD firings. Takes BP at home - SBP 110-120. No claudication.   Review of Systems: [y] = yes, [ ]  = no   General: Weight gain [ ] ; Weight loss [ ] ; Anorexia [ ] ; Fatigue [ ] ; Fever [ ] ; Chills [ ] ; Weakness [ ]   Cardiac: Chest pain/pressure [ ] ; Resting SOB [ ] ; Exertional SOB [ ] ; Orthopnea [ ] ; Pedal Edema [ ] ; Palpitations [ ] ; Syncope [ ] ; Presyncope [ ] ; Paroxysmal nocturnal dyspnea[ ]   Pulmonary: Cough [ ] ; Wheezing[ ] ; Hemoptysis[ ] ; Sputum [ ] ; Snoring [ ]   GI: Vomiting[ ] ; Dysphagia[ ] ; Melena[ ] ; Hematochezia [ ] ; Heartburn[ ] ; Abdominal pain [ ] ; Constipation [ ] ; Diarrhea [ ] ; BRBPR [ ]   GU: Hematuria[ ] ; Dysuria [ ] ; Nocturia[ ]   Vascular: Pain in legs with walking [ ] ; Pain in feet with lying flat [ ] ; Non-healing sores [ ] ; Stroke [ ] ; TIA [ ] ; Slurred speech [ ] ;  Neuro: Headaches[ ] ; Vertigo[ ] ; Seizures[ ] ; Paresthesias[ ] ;Blurred vision [ ] ; Diplopia [ ] ; Vision changes [ ]   Ortho/Skin: Arthritis [ ] ; Joint pain [ ] ; Muscle pain [ ] ; Joint swelling [ ] ; Back Pain [ ] ; Rash [ ]   Psych: Depression[ ] ; Anxiety[ ]   Heme: Bleeding problems [ ] ; Clotting disorders [ ] ;  Anemia [ ]   Endocrine: Diabetes [ ] ; Thyroid dysfunction[ ]    Past Medical History:  Diagnosis Date   CHF (congestive heart failure)    Coronary artery disease    Tobacco use     Current Outpatient Medications  Medication Sig Dispense Refill   aspirin EC 81 MG tablet Take 81 mg by mouth daily. Swallow whole.     atorvastatin (LIPITOR) 80 MG tablet Take 1 tablet (80 mg total) by mouth daily. 90 tablet 3   buprenorphine (SUBUTEX) 8 MG SUBL SL tablet Place 8 mg under the tongue daily.     clopidogrel (PLAVIX) 75 MG tablet Take 75 mg by mouth daily.     empagliflozin (JARDIANCE) 10 MG TABS tablet Take 1 tablet (10 mg total) by mouth daily before breakfast. 90 tablet 3   metoprolol succinate (TOPROL-XL) 25 MG 24 hr tablet Take 1 tablet (25 mg total) by mouth daily. 90 tablet 3   sacubitril-valsartan (ENTRESTO) 24-26 MG Take 1 tablet by mouth 2 (two) times daily. 180 tablet 3   spironolactone (ALDACTONE) 25 MG tablet Take 0.5 tablets (12.5 mg total) by mouth daily. 15 tablet 3   No current facility-administered medications for this visit.    No Known Allergies    Social History   Socioeconomic History  Marital status: Divorced    Spouse name: Not on file   Number of children: Not on file   Years of education: Not on file   Highest education level: Not on file  Occupational History   Not on file  Tobacco Use   Smoking status: Former    Types: Cigarettes    Quit date: 11/29/2020    Years since quitting: 2.2   Smokeless tobacco: Never  Substance and Sexual Activity   Alcohol use: Not Currently   Drug use: Not Currently    Types: Hydromorphone, Oxycodone    Comment: "past" use   Sexual activity: Not on file  Other Topics Concern   Not on file  Social History Narrative   Not on file   Social Determinants of Health   Financial Resource Strain: Not on file  Food Insecurity: Not on file  Transportation Needs: Not on file  Physical Activity: Not on file  Stress: Not on  file  Social Connections: Not on file  Intimate Partner Violence: Not on file     No family history on file.  Vitals:   03/04/23 1057  BP: 110/72  Pulse: 80  SpO2: 98%  Weight: 200 lb 6.4 oz (90.9 kg)    PHYSICAL EXAM: General:  Well appearing. No respiratory difficulty HEENT: normal Neck: supple. no JVD. Carotids 2+ bilat; no bruits. No lymphadenopathy or thryomegaly appreciated. Cor: PMI nondisplaced. Regular rate & rhythm. No rubs, gallops or murmurs. Lungs: clear Abdomen: soft, nontender, nondistended. No hepatosplenomegaly. No bruits or masses. Good bowel sounds. Extremities: no cyanosis, clubbing, rash, edema Neuro: alert & oriented x 3, cranial nerves grossly intact. moves all 4 extremities w/o difficulty. Affect pleasant.  ECG: SR 82. Anteroseptal Qs Personally reviewed   ASSESSMENT & PLAN:  1. Chronic systolic HF  - due to iCM - Anterior STEMI 12/21 s/p DES to LAD x 2  - Echo 11/08/20: EF of 30-35%. - Echo 02/04/23: EF 30-35% with mild MR.  - s/p BosSCi ICD in 5/22 (EP [facility]) - NYHA I - Volume status ok - GDMT titration limited by BP but stable on 4-drug GDMT - HR > 70 Will try to increase metoprolol succinate 25mg  daily -> 25 bid - Continue jardiance 10mg  daily - Continue entresto 24/26mg  BID; discussed titrating this at next visit - Continue Spiro 12.5 (failed 25mg  due to low BP)  - Can return to HF Clinic as needed. F/u Dr. Darrold JunkerParaschos    2. CAD - Anterior STEMI 12/21 LM: 30% LAD prox occlusion -> DES x 2, D2 70% LCx: 60%p, OM-1 75% RCA: 70%p - No s/s angina - atorvastatin 80mg  daily - Continue DAPT  - Managed by Dr. Darrold JunkerParaschos   3. Tobacco use - Quit 12/21  Arvilla Meresaniel Cassity Christian, MD  12:10 PM

## 2023-06-28 ENCOUNTER — Other Ambulatory Visit: Payer: Self-pay | Admitting: Family

## 2023-07-22 ENCOUNTER — Emergency Department
Admission: EM | Admit: 2023-07-22 | Discharge: 2023-07-22 | Disposition: A | Discharge status: Home / Self Care | Payer: BLUE CROSS/BLUE SHIELD | Attending: Emergency Medicine | Admitting: Emergency Medicine

## 2023-07-22 ENCOUNTER — Emergency Department: Payer: BLUE CROSS/BLUE SHIELD

## 2023-07-22 ENCOUNTER — Other Ambulatory Visit: Payer: Self-pay

## 2023-07-22 ENCOUNTER — Encounter: Payer: Self-pay | Admitting: Emergency Medicine

## 2023-07-22 DIAGNOSIS — S61009A Unspecified open wound of unspecified thumb without damage to nail, initial encounter: Secondary | ICD-10-CM

## 2023-07-22 DIAGNOSIS — S61111A Laceration without foreign body of right thumb with damage to nail, initial encounter: Secondary | ICD-10-CM | POA: Diagnosis not present

## 2023-07-22 DIAGNOSIS — I509 Heart failure, unspecified: Secondary | ICD-10-CM | POA: Diagnosis not present

## 2023-07-22 DIAGNOSIS — I2510 Atherosclerotic heart disease of native coronary artery without angina pectoris: Secondary | ICD-10-CM | POA: Diagnosis not present

## 2023-07-22 DIAGNOSIS — Y990 Civilian activity done for income or pay: Secondary | ICD-10-CM | POA: Insufficient documentation

## 2023-07-22 DIAGNOSIS — Z23 Encounter for immunization: Secondary | ICD-10-CM | POA: Diagnosis not present

## 2023-07-22 DIAGNOSIS — W240XXA Contact with lifting devices, not elsewhere classified, initial encounter: Secondary | ICD-10-CM | POA: Insufficient documentation

## 2023-07-22 DIAGNOSIS — S61011A Laceration without foreign body of right thumb without damage to nail, initial encounter: Secondary | ICD-10-CM | POA: Diagnosis present

## 2023-07-22 MED ORDER — CEPHALEXIN 500 MG PO CAPS: 500.0000 mg | ORAL_CAPSULE | Freq: Four times a day (QID) | ORAL | 0 refills | Status: AC

## 2023-07-22 MED ORDER — TETANUS-DIPHTH-ACELL PERTUSSIS 5-2.5-18.5 LF-MCG/0.5 IM SUSY
0.5000 mL | PREFILLED_SYRINGE | Freq: Once | INTRAMUSCULAR | Status: AC
  Administered 2023-07-22: 0.5 mL via INTRAMUSCULAR
  Filled 2023-07-22: qty 0.5

## 2023-07-22 MED ORDER — LIDOCAINE HCL (PF) 1 % IJ SOLN
5.0000 mL | Freq: Once | INTRAMUSCULAR | Status: AC
  Administered 2023-07-22: 5 mL via INTRADERMAL
  Filled 2023-07-22: qty 5

## 2023-07-22 MED ORDER — CEFTRIAXONE SODIUM 1 G IJ SOLR
1.0000 g | Freq: Once | INTRAMUSCULAR | Status: AC
  Administered 2023-07-22: 1 g via INTRAMUSCULAR
  Filled 2023-07-22: qty 10

## 2023-07-22 MED ORDER — TETANUS-DIPHTHERIA TOXOIDS TD 5-2 LFU IM INJ: 0.5000 mL | INJECTION | Freq: Once | INTRAMUSCULAR | Status: DC

## 2023-07-22 MED ORDER — LIDOCAINE HCL (PF) 1 % IJ SOLN
INTRAMUSCULAR | Status: AC
  Administered 2023-07-22: 2.1 mL
  Filled 2023-07-22: qty 5

## 2023-07-22 NOTE — ED Provider Notes (Signed)
  Physical Exam  BP (!) 135/91 (BP Location: Left Arm)   Pulse 76   Temp 98.2 F (36.8 C) (Oral)   Resp 16   Ht 5\' 10"  (1.778 m)   Wt 90.7 kg   SpO2 97%   BMI 28.70 kg/m   Physical Exam  Procedures  Procedures  ED Course / MDM    Medical Decision Making Amount and/or Complexity of Data Reviewed Radiology: ordered.  Risk Prescription drug management.   Assuming care from Greig Right, PA-C. Plan: Pending orthopedic specialist for further evaluation and management.  Dr. Rosann Auerbach of [facility] seen and evaluated the patient in the ED.  Recommendations to dressed wound and Rocephin antibiotic injection prior to discharge with outpatient antibiotic Keflex 4 times daily x 10 days .  Patient will need to follow-up with [facility] orthopedics on Monday.  Surgical interventions and options provided to patient.    Dressing is placed with Xeroform and gauze wrapping.  Dressing packet provided to patient. Encouraged dressing changes as needed.  ED precautions provided.  Patient is in satisfactory and stable condition at discharge.     Romeo Apple, Arthi Mcdonald A, PA-C 07/22/23 Mabeline Caras, MD 07/22/23 [id number]

## 2023-07-22 NOTE — ED Provider Notes (Signed)
-----------------------------------------   3:00 PM on 07/22/2023 ----------------------------------------- I have seen and evaluated the patient.  Patient suffered an accident to his right thumb which is his dominant hand.  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  I evaluated the amputated portion there is no muscle arteries or veins for reattachment in this area it is essentially the skin of the distal thumb with the thumbnail and nailbed.  Discussed with the patient that this could not be reattached.  Patient understands and is agreeable.  There is minimal bleeding currently.  Will place Surgicel over the area to help achieve hemostasis.  I would cover with antibiotics and have the patient follow-up with hand as the patient would likely require a revision such as bone rongeuring to get complete skin coverage although he has decent skin coverage currently.  Patient agreeable to plan of care.   Minna Antis, MD 07/22/23 1501

## 2023-07-22 NOTE — ED Provider Notes (Signed)
[facility] Provider Note    Event Date/Time   First MD Initiated Contact with Patient 07/22/23 1417     (approximate)   History   Laceration   HPI  Timothy Schultz is a 62 y.o. male with history of CHF, CAD, MI presents emergency department with right thumb injury.  Patient is right-handed.  Patient states he got his finger caught in between a forklift.  Hold the thumb back and the nail and part of his thumb pulled off.  Unsure of his last Tdap      Physical Exam   Triage Vital Signs: ED Triage Vitals  Encounter Vitals Group     BP 07/22/23 1325 (!) 135/91     Systolic BP Percentile --      Diastolic BP Percentile --      Pulse Rate 07/22/23 1325 76     Resp 07/22/23 1325 16     Temp 07/22/23 1325 98.2 F (36.8 C)     Temp Source 07/22/23 1325 Oral     SpO2 07/22/23 1325 97 %     Weight 07/22/23 1325 200 lb (90.7 kg)     Height 07/22/23 1325 5\' 10"  (1.778 m)     Head Circumference --      Peak Flow --      Pain Score 07/22/23 1324 2     Pain Loc --      Pain Education --      Exclude from Growth Chart --     Most recent vital signs: Vitals:   07/22/23 1325  BP: (!) 135/91  Pulse: 76  Resp: 16  Temp: 98.2 F (36.8 C)  SpO2: 97%     General: Awake, no distress.   CV:  Good peripheral perfusion. regular rate and  rhythm Resp:  Normal effort.  Abd:  No distention.   Other:  Right thumb with avulsion of the nail, nailbed is still intact, skin avulsed on the palmar surface also, no obvious bone noted in the avulsion   ED Results / Procedures / Treatments   Labs (all labs ordered are listed, but only abnormal results are displayed) Labs Reviewed - No data to display   EKG     RADIOLOGY X-ray of the right thumb    PROCEDURES:   .Nerve Block  Date/Time: 07/22/2023 3:17 PM  Performed by: Faythe Ghee, PA-C Authorized by: Faythe Ghee, PA-C   Consent:    Consent obtained:  Verbal   Consent given by:   Patient   Risks, benefits, and alternatives were discussed: yes     Risks discussed:  Allergic reaction, infection, nerve damage, swelling, unsuccessful block, pain, intravenous injection and bleeding   Alternatives discussed:  No treatment Universal protocol:    Procedure explained and questions answered to patient or proxy's satisfaction: yes     Immediately prior to procedure, a time out was called: yes     Patient identity confirmed:  Verbally with patient Indications:    Indications:  Pain relief Location:    Body area:  Upper extremity   Laterality:  Right Pre-procedure details:    Skin preparation:  Alcohol   Preparation: Patient was prepped and draped in usual sterile fashion   Procedure details:    Block needle gauge:  27 G   Anesthetic injected:  Lidocaine 1% w/o epi   Injection procedure:  Anatomic landmarks identified, incremental injection, negative aspiration for blood, anatomic landmarks palpated and introduced needle Post-procedure details:  Outcome:  Anesthesia achieved   Procedure completion:  Tolerated well, no immediate complications    MEDICATIONS ORDERED IN ED: Medications  lidocaine (PF) (XYLOCAINE) 1 % injection 5 mL (5 mLs Intradermal Given by Other 07/22/23 1500)  Tdap (BOOSTRIX) injection 0.5 mL (0.5 mLs Intramuscular Given 07/22/23 1501)     IMPRESSION / MDM / ASSESSMENT AND PLAN / ED COURSE  I reviewed the triage vital signs and the nursing notes.                              Differential diagnosis includes, but is not limited to, fracture, amputation, degloving  Patient's presentation is most consistent with acute presentation with potential threat to life or bodily function.   Patient does have the partial amputation with him, however there is no blood supply to the areas mostly his nail and some skin.  Physical exam does not show any exposed bone.  X-ray of the right thumb independently reviewed interpreted by me as being negative for  fracture, awaiting radiology read  Digital block performed, anesthesia achieved   Consult to Ortho.  Dr. Rosann Auerbach coming to see the patient.  Care is transferred to [facility], PA-C   FINAL CLINICAL IMPRESSION(S) / ED DIAGNOSES   Final diagnoses:  Degloving injury of thumb     Rx / DC Orders   ED Discharge Orders     None        Note:  This document was prepared using Dragon voice recognition software and may include unintentional dictation errors.    Faythe Ghee, PA-C 07/22/23 1534    Minna Antis, MD 07/22/23 [phone]

## 2023-07-22 NOTE — ED Notes (Signed)
Patient declined discharge vital signs. Patient ambulated from the unit with a steady gait. Family is driving him home.

## 2023-07-22 NOTE — ED Triage Notes (Signed)
Pt to ED via POV. Pt states that he cut his on finger on a piece of machinery at work. Bleeding is controlled at this time.

## 2023-09-22 ENCOUNTER — Other Ambulatory Visit: Payer: Self-pay | Admitting: Family

## 2023-09-29 ENCOUNTER — Encounter: Payer: Self-pay | Admitting: Family

## 2023-11-30 ENCOUNTER — Other Ambulatory Visit: Payer: Self-pay | Admitting: Family

## 2024-01-09 ENCOUNTER — Encounter: Payer: Self-pay | Admitting: Family

## 2024-01-10 ENCOUNTER — Other Ambulatory Visit (HOSPITAL_COMMUNITY): Payer: Self-pay

## 2024-01-10 ENCOUNTER — Other Ambulatory Visit: Payer: Self-pay

## 2024-01-10 NOTE — Telephone Encounter (Signed)
Timothy Schultz has active [employer] and does not qualify for [employer] patient assistance. His Entresto copay is $20 for 30 days or $60 for 90 days. We can supply him with copay cards that will make Entresto $10 for 90 days.

## 2024-01-10 NOTE — Telephone Encounter (Signed)
Could you please assist this pt with re-enrollment process. It's unclear to me whether he qualifies for 2025 assistance based on his insurance.

## 2024-01-16 ENCOUNTER — Encounter: Payer: Self-pay | Admitting: Family

## 2024-02-08 ENCOUNTER — Encounter: Payer: Self-pay | Admitting: Family

## 2024-02-08 ENCOUNTER — Other Ambulatory Visit: Payer: Self-pay

## 2024-02-08 MED ORDER — SACUBITRIL-VALSARTAN 24-26 MG PO TABS: 1.0000 | ORAL_TABLET | Freq: Two times a day (BID) | ORAL | 5 refills | Status: DC

## 2024-02-08 MED ORDER — SACUBITRIL-VALSARTAN 24-26 MG PO TABS: 1.0000 | ORAL_TABLET | Freq: Two times a day (BID) | ORAL | 5 refills | Status: AC

## 2024-02-08 NOTE — Addendum Note (Signed)
 Addended by: Noralee Space on: 02/08/2024 04:33 PM   Modules accepted: Orders

## 2024-02-13 ENCOUNTER — Encounter: Payer: Self-pay | Admitting: Internal Medicine
